# Patient Record
Sex: Female | Born: 1990 | Race: White | Hispanic: No | Marital: Single | Smoking: Current every day smoker
Health system: Southern US, Community
[De-identification: ages and names within clinical notes are randomized; demographics above are authoritative.]

## PROBLEM LIST (undated history)

## (undated) ENCOUNTER — Inpatient Hospital Stay (HOSPITAL_COMMUNITY): Payer: Self-pay

## (undated) DIAGNOSIS — K219 Gastro-esophageal reflux disease without esophagitis: Secondary | ICD-10-CM

## (undated) DIAGNOSIS — R52 Pain, unspecified: Secondary | ICD-10-CM

## (undated) DIAGNOSIS — N83209 Unspecified ovarian cyst, unspecified side: Secondary | ICD-10-CM

## (undated) DIAGNOSIS — F32A Depression, unspecified: Secondary | ICD-10-CM

## (undated) DIAGNOSIS — F329 Major depressive disorder, single episode, unspecified: Secondary | ICD-10-CM

## (undated) DIAGNOSIS — R51 Headache: Secondary | ICD-10-CM

## (undated) DIAGNOSIS — Z3490 Encounter for supervision of normal pregnancy, unspecified, unspecified trimester: Secondary | ICD-10-CM

## (undated) DIAGNOSIS — F419 Anxiety disorder, unspecified: Secondary | ICD-10-CM

## (undated) HISTORY — PX: ROOT CANAL: SHX2363

## (undated) HISTORY — DX: Pain, unspecified: R52

---
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
## 2012-01-12 ENCOUNTER — Emergency Department (HOSPITAL_BASED_OUTPATIENT_CLINIC_OR_DEPARTMENT_OTHER)
Admission: EM | Admit: 2012-01-12 | Discharge: 2012-01-12 | Disposition: A | Discharge status: Home / Self Care | Payer: Medicaid Other | Attending: Emergency Medicine | Admitting: Emergency Medicine

## 2012-01-12 ENCOUNTER — Encounter (HOSPITAL_BASED_OUTPATIENT_CLINIC_OR_DEPARTMENT_OTHER): Payer: Self-pay | Admitting: *Deleted

## 2012-01-12 DIAGNOSIS — K219 Gastro-esophageal reflux disease without esophagitis: Secondary | ICD-10-CM | POA: Insufficient documentation

## 2012-01-12 DIAGNOSIS — R197 Diarrhea, unspecified: Secondary | ICD-10-CM

## 2012-01-12 DIAGNOSIS — R109 Unspecified abdominal pain: Secondary | ICD-10-CM

## 2012-01-12 DIAGNOSIS — R112 Nausea with vomiting, unspecified: Secondary | ICD-10-CM

## 2012-01-12 LAB — URINALYSIS, ROUTINE W REFLEX MICROSCOPIC
Bilirubin Urine: NEGATIVE
Glucose, UA: NEGATIVE mg/dL
Hgb urine dipstick: NEGATIVE
Ketones, ur: NEGATIVE mg/dL
Leukocytes, UA: NEGATIVE
Nitrite: NEGATIVE
Protein, ur: NEGATIVE mg/dL
Specific Gravity, Urine: 1.039 — ABNORMAL HIGH (ref 1.005–1.030)
Urobilinogen, UA: 0.2 mg/dL (ref 0.0–1.0)
pH: 5.5 (ref 5.0–8.0)

## 2012-01-12 LAB — PREGNANCY, URINE: Preg Test, Ur: NEGATIVE

## 2012-01-12 MED ORDER — ONDANSETRON HCL 4 MG/2ML IJ SOLN
4.0000 mg | Freq: Once | INTRAMUSCULAR | Status: AC
  Administered 2012-01-12: 4 mg via INTRAVENOUS
  Filled 2012-01-12: qty 2

## 2012-01-12 MED ORDER — SODIUM CHLORIDE 0.9 % IV BOLUS (SEPSIS)
1000.0000 mL | Freq: Once | INTRAVENOUS | Status: AC
  Administered 2012-01-12: 1000 mL via INTRAVENOUS

## 2012-01-12 MED ORDER — LORAZEPAM 2 MG/ML IJ SOLN
1.0000 mg | Freq: Once | INTRAMUSCULAR | Status: AC
  Administered 2012-01-12: 1 mg via INTRAVENOUS
  Filled 2012-01-12: qty 1

## 2012-01-12 MED ORDER — HYDROMORPHONE HCL PF 1 MG/ML IJ SOLN
1.0000 mg | Freq: Once | INTRAMUSCULAR | Status: AC
  Administered 2012-01-12: 1 mg via INTRAVENOUS
  Filled 2012-01-12: qty 1

## 2012-01-12 MED ORDER — ONDANSETRON HCL 4 MG PO TABS: 4.0000 mg | ORAL_TABLET | Freq: Four times a day (QID) | ORAL | Status: AC

## 2012-01-12 NOTE — ED Provider Notes (Signed)
History    20yf with abdominal pain and n/v/d. Onset around 0300 today. Woke her up from sleep.  In usual state of health when went to bed. Pain is diffuse and crampy. Worse near umbilicus. Comes in waves but doesn't completely resolve. NBNB emesis. No blood in stool. No fever. No sick contacts. No urinary complaints. Doesn't think pregnant. No unusual vaginal bleeding or discharge. Denies hx of abdominal or pelvic surgery.  CSN: [id number]  Arrival date & time 01/12/12  0530   First MD Initiated Contact with Patient 01/12/12 0540      Chief Complaint  Patient presents with  . Emesis  . Diarrhea  . Abdominal Pain    (Consider location/radiation/quality/duration/timing/severity/associated sxs/prior treatment) HPI  Past Medical History  Diagnosis Date  . Anxiety   . Depression   . GERD (gastroesophageal reflux disease)     History reviewed. No pertinent past surgical history.  No family history on file.  History  Substance Use Topics  . Smoking status: Never Smoker   . Smokeless tobacco: Not on file  . Alcohol Use: No    OB History    Grav Para Term Preterm Abortions TAB SAB Ect Mult Living                  Review of Systems   Review of symptoms negative unless otherwise noted in HPI.   Allergies  Review of patient's allergies indicates no known allergies.  Home Medications   Current Outpatient Rx  Name Route Sig Dispense Refill  . ALBUTEROL 90 MCG/ACT IN AERS Inhalation Inhale 2 puffs into the lungs 2 (two) times daily as needed. For shortness of breath     . CLONAZEPAM 0.5 MG PO TABS Oral Take 0.25 mg by mouth 3 (three) times daily as needed. anxiety     . NORGESTIM-ETH ESTRAD TRIPHASIC 0.18/0.215/0.25 MG-35 MCG PO TABS Oral Take 1 tablet by mouth daily.      . ACETAMINOPHEN 500 MG PO TABS Oral Take 500 mg by mouth every 6 (six) hours as needed. pain     . ESCITALOPRAM OXALATE 10 MG PO TABS Oral Take 10 mg by mouth daily.      Marland Kitchen ESOMEPRAZOLE MAGNESIUM 40  MG PO CPDR Oral Take 1 capsule (40 mg total) by mouth daily. 30 capsule 0  . GUAIFENESIN ER 600 MG PO TB12 Oral Take 600 mg by mouth daily.      Marland Kitchen PROMETHAZINE HCL 25 MG PO TABS Oral Take 1 tablet (25 mg total) by mouth every 6 (six) hours as needed for nausea. 15 tablet 0  . PROMETHAZINE HCL 25 MG PO TABS Oral Take 25 mg by mouth 2 (two) times daily as needed. For nausea or vomiting       BP 124/90  Pulse 102  Temp 97.6 F (36.4 C) (Oral)  Resp 20  Ht 5' (1.524 m)  Wt 140 lb (63.504 kg)  BMI 27.34 kg/m2  SpO2 100%  LMP 12/21/2011  Physical Exam  Nursing note and vitals reviewed. Constitutional: She appears well-developed and well-nourished. No distress.  HENT:  Head: Normocephalic and atraumatic.  Eyes: Conjunctivae are normal. Right eye exhibits no discharge. Left eye exhibits no discharge.  Neck: Neck supple.  Cardiovascular: Normal rate, regular rhythm and normal heart sounds.  Exam reveals no gallop and no friction rub.   No murmur heard. Pulmonary/Chest: Effort normal and breath sounds normal. No respiratory distress.  Abdominal: Soft. She exhibits no distension and no mass. There is tenderness.  There is no rebound and no guarding.       Mild diffuse tenderness without rebound or guarding.  Genitourinary:       No cva tenderness  Musculoskeletal: She exhibits no edema and no tenderness.  Neurological: She is alert.  Skin: Skin is warm and dry.  Psychiatric: Her behavior is normal. Thought content normal.    ED Course  Procedures (including critical care time)  Labs Reviewed  URINALYSIS, ROUTINE W REFLEX MICROSCOPIC - Abnormal; Notable for the following:    Color, Urine AMBER (*)  BIOCHEMICALS MAY BE AFFECTED BY COLOR   APPearance CLOUDY (*)     Specific Gravity, Urine 1.039 (*)     All other components within normal limits  PREGNANCY, URINE   No results found.   1. Abdominal pain   2. Nausea and vomiting   3. Diarrhea       MDM  20yF with abdominal  pain and n/v/d. I suspect this is viral gastroenteritis. Given symptom duration of only a couple hours and reassuring exam, I do not feel that imaging or blood work indicated. I have a low suspicion for acute surgical process at this time. Plan IVF, meds and UA. Will continue to reasses.  7:23 AM Prior to DC pt reports feeling much better although still with some nausea. Repeat abdominal exam remains with mild diffuse tenderness. Does not localize. I remain with a low suspicion for acute abdomen but strict return precautions discussed. Imaging again consider but deferred. Outpt fu.        Raeford Razor, MD 01/12/12 [phone]

## 2012-03-06 ENCOUNTER — Emergency Department (HOSPITAL_BASED_OUTPATIENT_CLINIC_OR_DEPARTMENT_OTHER)
Admission: EM | Admit: 2012-03-06 | Discharge: 2012-03-06 | Disposition: A | Discharge status: Home / Self Care | Payer: Medicaid Other | Attending: Emergency Medicine | Admitting: Emergency Medicine

## 2012-03-06 ENCOUNTER — Emergency Department (HOSPITAL_BASED_OUTPATIENT_CLINIC_OR_DEPARTMENT_OTHER): Payer: Medicaid Other

## 2012-03-06 ENCOUNTER — Encounter (HOSPITAL_BASED_OUTPATIENT_CLINIC_OR_DEPARTMENT_OTHER): Payer: Self-pay

## 2012-03-06 ENCOUNTER — Inpatient Hospital Stay (HOSPITAL_COMMUNITY)
Admission: AD | Admit: 2012-03-06 | Discharge: 2012-03-06 | Disposition: A | Discharge status: Home / Self Care | Payer: Self-pay | Source: Ambulatory Visit | Attending: Obstetrics & Gynecology | Admitting: Obstetrics & Gynecology

## 2012-03-06 DIAGNOSIS — M545 Low back pain, unspecified: Secondary | ICD-10-CM | POA: Insufficient documentation

## 2012-03-06 DIAGNOSIS — Z331 Pregnant state, incidental: Secondary | ICD-10-CM | POA: Insufficient documentation

## 2012-03-06 DIAGNOSIS — R10819 Abdominal tenderness, unspecified site: Secondary | ICD-10-CM | POA: Insufficient documentation

## 2012-03-06 DIAGNOSIS — Z3490 Encounter for supervision of normal pregnancy, unspecified, unspecified trimester: Secondary | ICD-10-CM

## 2012-03-06 DIAGNOSIS — R109 Unspecified abdominal pain: Secondary | ICD-10-CM | POA: Insufficient documentation

## 2012-03-06 HISTORY — DX: Unspecified ovarian cyst, unspecified side: N83.209

## 2012-03-06 LAB — BASIC METABOLIC PANEL
CO2: 22 mEq/L (ref 19–32)
Chloride: 99 mEq/L (ref 96–112)
Creatinine, Ser: 0.5 mg/dL (ref 0.50–1.10)
GFR calc Af Amer: >90 mL/min (ref >90)
Potassium: 4.1 mEq/L (ref 3.5–5.1)

## 2012-03-06 LAB — CBC WITH DIFFERENTIAL/PLATELET
Basophils Absolute: 0 10*3/uL (ref 0.0–0.1)
Basophils Relative: 0 % (ref 0–1)
HCT: 38.9 % (ref 36.0–46.0)
Hemoglobin: 13.1 g/dL (ref 12.0–15.0)
Lymphocytes Relative: 18 % (ref 12–46)
Monocytes Absolute: 1.2 10*3/uL — ABNORMAL HIGH (ref 0.1–1.0)
Neutro Abs: 9.8 10*3/uL — ABNORMAL HIGH (ref 1.7–7.7)
Neutrophils Relative %: 72 % (ref 43–77)
RDW: 13.6 % (ref 11.5–15.5)
WBC: 13.6 10*3/uL — ABNORMAL HIGH (ref 4.0–10.5)

## 2012-03-06 LAB — ABO/RH: ABO/RH(D): A POS

## 2012-03-06 LAB — URINALYSIS, ROUTINE W REFLEX MICROSCOPIC
Glucose, UA: NEGATIVE mg/dL
Hgb urine dipstick: NEGATIVE
Leukocytes, UA: NEGATIVE
Specific Gravity, Urine: 1.026 (ref 1.005–1.030)

## 2012-03-06 LAB — WET PREP, GENITAL
Trich, Wet Prep: NONE SEEN
Yeast Wet Prep HPF POC: NONE SEEN

## 2012-03-06 LAB — PREGNANCY, URINE: Preg Test, Ur: POSITIVE — AB

## 2012-03-06 LAB — HCG, QUANTITATIVE, PREGNANCY: hCG, Beta Chain, Quant, S: 79369 m[IU]/mL — ABNORMAL HIGH (ref <5)

## 2012-03-06 MED ORDER — ONDANSETRON HCL 4 MG/2ML IJ SOLN
4.0000 mg | Freq: Once | INTRAMUSCULAR | Status: AC
  Administered 2012-03-06: 4 mg via INTRAVENOUS
  Filled 2012-03-06: qty 2

## 2012-03-06 MED ORDER — ONDANSETRON HCL 4 MG PO TABS: 4.0000 mg | ORAL_TABLET | Freq: Three times a day (TID) | ORAL | Status: AC | PRN

## 2012-03-06 MED ORDER — SODIUM CHLORIDE 0.9 % IV BOLUS (SEPSIS)
1000.0000 mL | Freq: Once | INTRAVENOUS | Status: AC
  Administered 2012-03-06: 1000 mL via INTRAVENOUS

## 2012-03-06 NOTE — ED Notes (Signed)
C/o abd pain x 4 days-2 positive preg test-LMP 01/30/12

## 2012-03-06 NOTE — ED Provider Notes (Signed)
History     CSN: [id number]  Arrival date & time 03/06/12  1355   First MD Initiated Contact with Patient 03/06/12 1454      Chief Complaint  Patient presents with  . Abdominal Pain    (Consider location/radiation/quality/duration/timing/severity/associated sxs/prior treatment) HPI Pt reports several days of moderate aching lower abdominal pain, associated with nausea and occasional vomiting. She has also had some aching in R flank, no diarrhea or constipation. No dysuria or hematuria, but some increased urinary frequency. Denies any vaginal bleeding or discharge but has had a positive pregnancy test at home. No particular provoking or relieving factors.   Past Medical History  Diagnosis Date  . Anxiety   . Depression   . GERD (gastroesophageal reflux disease)   . Ovarian cyst     History reviewed. No pertinent past surgical history.  No family history on file.  History  Substance Use Topics  . Smoking status: Current Some Day Smoker  . Smokeless tobacco: Not on file  . Alcohol Use: No    OB History    Grav Para Term Preterm Abortions TAB SAB Ect Mult Living                  Review of Systems All other systems reviewed and are negative except as noted in HPI.   Allergies  Review of patient's allergies indicates no known allergies.  Home Medications   Current Outpatient Rx  Name Route Sig Dispense Refill  . ACETAMINOPHEN 500 MG PO TABS Oral Take 500 mg by mouth every 6 (six) hours as needed. pain     . ALBUTEROL 90 MCG/ACT IN AERS Inhalation Inhale 2 puffs into the lungs 2 (two) times daily as needed. For shortness of breath     . CLONAZEPAM 0.5 MG PO TABS Oral Take 0.25 mg by mouth 3 (three) times daily as needed. anxiety     . ESCITALOPRAM OXALATE 10 MG PO TABS Oral Take 10 mg by mouth daily.      Marland Kitchen ESOMEPRAZOLE MAGNESIUM 40 MG PO CPDR Oral Take 1 capsule (40 mg total) by mouth daily. 30 capsule 0  . GUAIFENESIN ER 600 MG PO TB12 Oral Take 600 mg by mouth  daily.      Marland Kitchen NORGESTIM-ETH ESTRAD TRIPHASIC 0.18/0.215/0.25 MG-35 MCG PO TABS Oral Take 1 tablet by mouth daily.      Marland Kitchen PROMETHAZINE HCL 25 MG PO TABS Oral Take 1 tablet (25 mg total) by mouth every 6 (six) hours as needed for nausea. 15 tablet 0  . PROMETHAZINE HCL 25 MG PO TABS Oral Take 25 mg by mouth 2 (two) times daily as needed. For nausea or vomiting       BP 138/81  Pulse 88  Temp 98 F (36.7 C) (Oral)  Resp 20  Ht 5\' 3"  (1.6 m)  Wt 135 lb (61.236 kg)  BMI 23.91 kg/m2  SpO2 100%  LMP 01/30/2012  Physical Exam  Nursing note and vitals reviewed. Constitutional: She is oriented to person, place, and time. She appears well-developed and well-nourished.  HENT:  Head: Normocephalic and atraumatic.  Eyes: EOM are normal. Pupils are equal, round, and reactive to light.  Neck: Normal range of motion. Neck supple.  Cardiovascular: Normal rate, normal heart sounds and intact distal pulses.   Pulmonary/Chest: Effort normal and breath sounds normal.  Abdominal: Bowel sounds are normal. She exhibits no distension. There is tenderness (suprapubic tenderness is mild). There is no rebound and no guarding.  Genitourinary: Uterus  normal. Uterus is not tender. Cervix exhibits no motion tenderness, no discharge and no friability. Right adnexum displays tenderness. Right adnexum displays no mass. Left adnexum displays no mass and no tenderness.  Musculoskeletal: Normal range of motion. She exhibits no edema and no tenderness.  Neurological: She is alert and oriented to person, place, and time. She has normal strength. No cranial nerve deficit or sensory deficit.  Skin: Skin is warm and dry. No rash noted.  Psychiatric: She has a normal mood and affect.    ED Course  Procedures (including critical care time)  Labs Reviewed  URINALYSIS, ROUTINE W REFLEX MICROSCOPIC - Abnormal; Notable for the following:    APPearance HAZY (*)     All other components within normal limits  PREGNANCY, URINE -  Abnormal; Notable for the following:    Preg Test, Ur POSITIVE (*)     All other components within normal limits  CBC WITH DIFFERENTIAL - Abnormal; Notable for the following:    WBC 13.6 (*)     Neutro Abs 9.8 (*)     Monocytes Absolute 1.2 (*)     All other components within normal limits  BASIC METABOLIC PANEL - Abnormal; Notable for the following:    Sodium 134 (*)     All other components within normal limits  HCG, QUANTITATIVE, PREGNANCY - Abnormal; Notable for the following:    hCG, Beta Chain, Quant, [place] [address] (*)     All other components within normal limits  WET PREP, GENITAL - Abnormal; Notable for the following:    Clue Cells Wet Prep HPF POC FEW (*)     WBC, Wet Prep HPF POC FEW (*)     All other components within normal limits  GC/CHLAMYDIA PROBE AMP, GENITAL  ABO/RH   US Ob Comp Less 14 Wks  03/06/2012  *RADIOLOGY REPORT*  Clinical Data: Patient pregnant complaining of pelvic pain and low back pain.  History of ovarian cysts.  OBSTETRIC <14 WK [place] AND TRANSVAGINAL OB US  Technique:  Both transabdominal and transvaginal ultrasound examinations were performed for complete evaluation of the gestation as well as the maternal uterus, adnexal regions, and pelvic cul-de-sac.  Transvaginal technique was performed to assess early pregnancy.  Comparison:  None.  Intrauterine gestational sac:  Visualized/normal in shape. Yolk sac: No sac visualized, normal in configuration Embryo: There is a fetal pole with fetal heart activity documented Cardiac Activity: The heart rate documented at 160 beats per minute Heart Rate: 160 beats per minute bpm  MSD: Not measured but, according to in size with the embryo CRL: 12.6  mm  seven w  3 d         [place] EDC: 10/20/2012  Maternal uterus/adnexae: The uterus is unremarkable.  Left ovary demonstrates a 1.7 cm hypoechoic lesion most likely a corpus luteum.  Ovaries are otherwise unremarkable.  No adnexal masses.  No free fluid.  IMPRESSION: Single live  intrauterine pregnancy with a measured gestational age of [redacted] weeks and 3 days.  No emerging complication.  1.7 cm hypoechoic left ovarian lesion, likely a corpus luteum.   Original Report Authenticated By: Domenic Moras, M.D.    US Ob Transvaginal  03/06/2012  *RADIOLOGY REPORT*  Clinical Data: Patient pregnant complaining of pelvic pain and low back pain.  History of ovarian cysts.  OBSTETRIC <14 WK [place] AND TRANSVAGINAL OB US  Technique:  Both transabdominal and transvaginal ultrasound examinations were performed for complete evaluation of the gestation as well as the maternal  uterus, adnexal regions, and pelvic cul-de-sac.  Transvaginal technique was performed to assess early pregnancy.  Comparison:  None.  Intrauterine gestational sac:  Visualized/normal in shape. Yolk sac: No sac visualized, normal in configuration Embryo: There is a fetal pole with fetal heart activity documented Cardiac Activity: The heart rate documented at 160 beats per minute Heart Rate: 160 beats per minute bpm  MSD: Not measured but, according to in size with the embryo CRL: 12.6  mm  seven w  3 d         [place] EDC: 10/20/2012  Maternal uterus/adnexae: The uterus is unremarkable.  Left ovary demonstrates a 1.7 cm hypoechoic lesion most likely a corpus luteum.  Ovaries are otherwise unremarkable.  No adnexal masses.  No free fluid.  IMPRESSION: Single live intrauterine pregnancy with a measured gestational age of [redacted] weeks and 3 days.  No emerging complication.  1.7 cm hypoechoic left ovarian lesion, likely a corpus luteum.   Original Report Authenticated By: Domenic Moras, M.D.      No diagnosis found.    MDM  Labs and [place] as above consistent with normal IUP. Rh is not resulted yet but with no bleeding, unlikely to give Rhogam at this visit regardless. Pt advised to stop medications harmful to fetus, begin multivitamin, no EtOH, smoking or drug use. Zofran for nausea. Establish with Ob.        Johnathyn Viscomi B. Bernette Mayers,  MD 03/06/12 1610

## 2012-03-17 ENCOUNTER — Emergency Department (HOSPITAL_BASED_OUTPATIENT_CLINIC_OR_DEPARTMENT_OTHER)
Admission: EM | Admit: 2012-03-17 | Discharge: 2012-03-17 | Disposition: A | Discharge status: Home / Self Care | Payer: Medicaid Other | Attending: Emergency Medicine | Admitting: Emergency Medicine

## 2012-03-17 ENCOUNTER — Encounter (HOSPITAL_BASED_OUTPATIENT_CLINIC_OR_DEPARTMENT_OTHER): Payer: Self-pay | Admitting: *Deleted

## 2012-03-17 DIAGNOSIS — O211 Hyperemesis gravidarum with metabolic disturbance: Secondary | ICD-10-CM | POA: Insufficient documentation

## 2012-03-17 DIAGNOSIS — O210 Mild hyperemesis gravidarum: Secondary | ICD-10-CM

## 2012-03-17 DIAGNOSIS — E860 Dehydration: Secondary | ICD-10-CM | POA: Insufficient documentation

## 2012-03-17 DIAGNOSIS — R42 Dizziness and giddiness: Secondary | ICD-10-CM

## 2012-03-17 HISTORY — DX: Encounter for supervision of normal pregnancy, unspecified, unspecified trimester: Z34.90

## 2012-03-17 LAB — COMPREHENSIVE METABOLIC PANEL
ALT: 9 U/L (ref 0–35)
AST: 13 U/L (ref 0–37)
Albumin: 3.5 g/dL (ref 3.5–5.2)
Alkaline Phosphatase: 66 U/L (ref 39–117)
Chloride: 102 mEq/L (ref 96–112)
Potassium: 4 mEq/L (ref 3.5–5.1)
Sodium: 136 mEq/L (ref 135–145)
Total Protein: 6.6 g/dL (ref 6.0–8.3)

## 2012-03-17 LAB — URINALYSIS, ROUTINE W REFLEX MICROSCOPIC
Bilirubin Urine: NEGATIVE
Glucose, UA: NEGATIVE mg/dL
Hgb urine dipstick: NEGATIVE
Nitrite: NEGATIVE
Specific Gravity, Urine: 1.034 — ABNORMAL HIGH (ref 1.005–1.030)
pH: 7 (ref 5.0–8.0)

## 2012-03-17 LAB — CBC WITH DIFFERENTIAL/PLATELET
Basophils Absolute: 0 10*3/uL (ref 0.0–0.1)
Basophils Relative: 0 % (ref 0–1)
Eosinophils Absolute: 0.2 10*3/uL (ref 0.0–0.7)
MCH: 29 pg (ref 26.0–34.0)
MCHC: 33.9 g/dL (ref 30.0–36.0)
Neutro Abs: 8.3 10*3/uL — ABNORMAL HIGH (ref 1.7–7.7)
Neutrophils Relative %: 68 % (ref 43–77)
Platelets: 263 10*3/uL (ref 150–400)

## 2012-03-17 LAB — HCG, QUANTITATIVE, PREGNANCY: hCG, Beta Chain, Quant, S: 85641 m[IU]/mL — ABNORMAL HIGH (ref <5)

## 2012-03-17 LAB — URINE MICROSCOPIC-ADD ON

## 2012-03-17 MED ORDER — NITROFURANTOIN MONOHYD MACRO 100 MG PO CAPS
100.0000 mg | ORAL_CAPSULE | Freq: Once | ORAL | Status: AC
  Administered 2012-03-17: 100 mg via ORAL
  Filled 2012-03-17: qty 1

## 2012-03-17 MED ORDER — NITROFURANTOIN MONOHYD MACRO 100 MG PO CAPS: 100.0000 mg | ORAL_CAPSULE | Freq: Two times a day (BID) | ORAL | Status: DC

## 2012-03-17 MED ORDER — SODIUM CHLORIDE 0.9 % IV BOLUS (SEPSIS)
1000.0000 mL | Freq: Once | INTRAVENOUS | Status: AC
  Administered 2012-03-17: 1000 mL via INTRAVENOUS

## 2012-03-17 MED ORDER — METOCLOPRAMIDE HCL 5 MG/ML IJ SOLN
10.0000 mg | Freq: Once | INTRAMUSCULAR | Status: AC
  Administered 2012-03-17: 10 mg via INTRAVENOUS
  Filled 2012-03-17: qty 2

## 2012-03-17 MED ORDER — METOCLOPRAMIDE HCL 10 MG PO TABS: 10.0000 mg | ORAL_TABLET | Freq: Four times a day (QID) | ORAL | Status: DC

## 2012-03-17 NOTE — ED Provider Notes (Signed)
History     CSN: [id number]  Arrival date & time 03/17/12  4098   First MD Initiated Contact with Patient 03/17/12 1944      Chief Complaint  Patient presents with  . Dizziness    (Consider location/radiation/quality/duration/timing/severity/associated sxs/prior treatment) The history is provided by the patient.  Ashley Walls is a 21 y.o. female hx of depression, anxiety here with dizziness, dehydration. Recently diagnosed with intrauterine pregnancy (confirmed on [place] 2 weeks ago), now [redacted] weeks pregnant here with dizziness, vomiting. Intermittent vomiting for several weeks, but able to tolerate PO. Today, while at work, she felt dizzy and lightheaded. Some vertigo as well. No syncope or chest pain or SOB. No vaginal bleeding, just occasional cramps.    Past Medical History  Diagnosis Date  . Anxiety   . Depression   . GERD (gastroesophageal reflux disease)   . Ovarian cyst   . Pregnant     History reviewed. No pertinent past surgical history.  No family history on file.  History  Substance Use Topics  . Smoking status: Former Smoker    Quit date: 02/19/2012  . Smokeless tobacco: Never Used  . Alcohol Use: No    OB History    Grav Para Term Preterm Abortions TAB SAB Ect Mult Living                  Review of Systems  Gastrointestinal: Positive for vomiting.  Neurological: Positive for dizziness.  All other systems reviewed and are negative.    Allergies  Review of patient's allergies indicates no known allergies.  Home Medications   Current Outpatient Rx  Name Route Sig Dispense Refill  . PROMETHAZINE HCL 25 MG PO TABS Oral Take 25 mg by mouth every 6 (six) hours as needed.    . ACETAMINOPHEN 500 MG PO TABS Oral Take 500 mg by mouth every 6 (six) hours as needed. pain     . ALBUTEROL 90 MCG/ACT IN AERS Inhalation Inhale 2 puffs into the lungs 2 (two) times daily as needed. For shortness of breath     . ESOMEPRAZOLE MAGNESIUM 40 MG PO CPDR Oral Take 1 capsule  (40 mg total) by mouth daily. 30 capsule 0    BP 117/75  Pulse 93  Temp 98.3 F (36.8 C) (Oral)  Resp 18  Ht 5' (1.524 m)  Wt 134 lb (60.782 kg)  BMI 26.17 kg/m2  SpO2 99%  LMP 01/30/2012  Physical Exam  Nursing note and vitals reviewed. Constitutional: She is oriented to person, place, and time. She appears well-developed and well-nourished.       anxious  HENT:  Head: Normocephalic.       OP slightly dry  Eyes: Conjunctivae normal are normal. Pupils are equal, round, and reactive to light.       No nystagmus.   Neck: Normal range of motion. Neck supple.  Cardiovascular: Normal rate, regular rhythm and normal heart sounds.   Pulmonary/Chest: Effort normal and breath sounds normal. No respiratory distress. She has no wheezes.  Abdominal: Soft. Bowel sounds are normal. She exhibits no distension. There is no tenderness.  Musculoskeletal: Normal range of motion.  Neurological: She is alert and oriented to person, place, and time. No cranial nerve deficit.       Nl strength and sensation   Skin: Skin is warm and dry.  Psychiatric: She has a normal mood and affect. Her behavior is normal. Judgment and thought content normal.    ED Course  Procedures (including  critical care time)  Labs Reviewed  CBC WITH DIFFERENTIAL - Abnormal; Notable for the following:    WBC 12.3 (*)     Neutro Abs 8.3 (*)     Monocytes Absolute 1.1 (*)     All other components within normal limits  COMPREHENSIVE METABOLIC PANEL - Abnormal; Notable for the following:    Total Bilirubin 0.1 (*)     All other components within normal limits  HCG, QUANTITATIVE, PREGNANCY - Abnormal; Notable for the following:    hCG, Beta Chain, Quant, [place] [address] (*)     All other components within normal limits  URINALYSIS, ROUTINE W REFLEX MICROSCOPIC - Abnormal; Notable for the following:    Specific Gravity, Urine 1.034 (*)     Protein, ur 30 (*)     All other components within normal limits  URINE MICROSCOPIC-ADD  ON - Abnormal; Notable for the following:    Squamous Epithelial / LPF FEW (*)     Bacteria, UA MANY (*)     All other components within normal limits   No results found.   1. Hyperemesis gravidarum   2. Dizziness   3. Dehydration       MDM  Ashley Walls is a 21 y.o. female [redacted] weeks pregnant here with hyperemesis and dizziness likely from dehydration. Will check labs, UA, given IVF and reglan and reassess.   9:36 PM Patient felt better after reglan and fluids. Labs showed Wbc 12, UA + bacteria. She was given macrobid. No vaginal discharge so no ultrasound performed. She is d/c home on regland and macrobid.         Richardean Canal, MD 03/17/12 2137

## 2012-03-17 NOTE — ED Notes (Signed)
Pt reports dizziness and nausea while working yesterday, still dizzy today- pt states she is [redacted] weeks pregnant- c/o intermittent sharp pelvic pain - some vaginal d/c- denies cramping or bleeding

## 2012-03-17 NOTE — ED Notes (Signed)
MD at bedside. 

## 2012-04-24 ENCOUNTER — Other Ambulatory Visit: Payer: Self-pay | Admitting: Obstetrics and Gynecology

## 2012-04-24 LAB — OB RESULTS CONSOLE RPR: RPR: NONREACTIVE

## 2012-04-24 LAB — OB RESULTS CONSOLE HEPATITIS B SURFACE ANTIGEN: Hepatitis B Surface Ag: NEGATIVE

## 2012-04-24 LAB — OB RESULTS CONSOLE GC/CHLAMYDIA: Chlamydia: NEGATIVE

## 2012-04-24 LAB — OB RESULTS CONSOLE ANTIBODY SCREEN: Antibody Screen: NEGATIVE

## 2012-04-27 ENCOUNTER — Encounter (HOSPITAL_COMMUNITY): Payer: Self-pay | Admitting: *Deleted

## 2012-04-27 ENCOUNTER — Inpatient Hospital Stay (HOSPITAL_COMMUNITY)
Admission: AD | Admit: 2012-04-27 | Discharge: 2012-04-27 | Disposition: A | Discharge status: Home / Self Care | Payer: Medicaid Other | Source: Ambulatory Visit | Attending: Obstetrics and Gynecology | Admitting: Obstetrics and Gynecology

## 2012-04-27 DIAGNOSIS — O9989 Other specified diseases and conditions complicating pregnancy, childbirth and the puerperium: Secondary | ICD-10-CM

## 2012-04-27 DIAGNOSIS — R109 Unspecified abdominal pain: Secondary | ICD-10-CM | POA: Insufficient documentation

## 2012-04-27 DIAGNOSIS — N949 Unspecified condition associated with female genital organs and menstrual cycle: Secondary | ICD-10-CM

## 2012-04-27 DIAGNOSIS — O26899 Other specified pregnancy related conditions, unspecified trimester: Secondary | ICD-10-CM

## 2012-04-27 DIAGNOSIS — O99891 Other specified diseases and conditions complicating pregnancy: Secondary | ICD-10-CM | POA: Insufficient documentation

## 2012-04-27 LAB — URINALYSIS, ROUTINE W REFLEX MICROSCOPIC
Glucose, UA: NEGATIVE mg/dL
Hgb urine dipstick: NEGATIVE
Ketones, ur: NEGATIVE mg/dL
Leukocytes, UA: NEGATIVE
Protein, ur: NEGATIVE mg/dL

## 2012-04-27 NOTE — MAU Provider Note (Signed)
  History     CSN: [id number]  Arrival date and time: 04/27/12 2030   First Provider Initiated Contact with Patient 04/27/12 2251      Chief Complaint  Patient presents with  . Abdominal Pain   HPI Ms Bansal is a 21yo G1P0 at approx 14.6wks who presents for eval of intermittent low abd sharp pains x 2 days. Denies vag bldg or leak. No N/V/D, fever or dysuria. Took Ibuprofen earlier this evening which gave some relief of the pain but she still desired eval.   Past Medical History  Diagnosis Date  . Anxiety   . Depression   . GERD (gastroesophageal reflux disease)   . Ovarian cyst   . Pregnant     No past surgical history on file.  Family History  Problem Relation Age of Onset  . COPD Mother   . Asthma Mother   . Depression Mother     History  Substance Use Topics  . Smoking status: Former Smoker    Quit date: 02/19/2012  . Smokeless tobacco: Never Used  . Alcohol Use: No    Allergies: No Known Allergies  Prescriptions prior to admission  Medication Sig Dispense Refill  . ibuprofen (ADVIL,MOTRIN) 200 MG tablet Take 800 mg by mouth every 6 (six) hours as needed. pain      . ondansetron (ZOFRAN) 4 MG tablet Take 4 mg by mouth every 8 (eight) hours as needed. nausea      . Prenatal Vit-Fe Fumarate-FA (PRENATAL MULTIVITAMIN) TABS Take 1 tablet by mouth daily.      Marland Kitchen albuterol (PROVENTIL,VENTOLIN) 90 MCG/ACT inhaler Inhale 2 puffs into the lungs 2 (two) times daily as needed. For shortness of breath         ROS  Physical Exam   Blood pressure 115/67, pulse 86, temperature 97.7 F (36.5 C), temperature source Oral, resp. rate 20, height 5' (1.524 m), weight 64.32 kg (141 lb 12.8 oz), last menstrual period 01/30/2012.  Physical Exam  Constitutional: She is oriented to person, place, and time. She appears well-developed.  HENT:  Head: Normocephalic.  Cardiovascular: Normal rate.   Respiratory: Effort normal.  GI: Soft.       Fundal height c/w 15 wks, approx 3FBs  below umbilicus; FHTs dopplered 150; exam unremarkable- unable to elicit pain  Genitourinary: Vagina normal.       Cx closed/thick/post  Musculoskeletal: Normal range of motion.  Neurological: She is alert and oriented to person, place, and time.  Skin: Skin is warm and dry.  Psychiatric: She has a normal mood and affect. Her behavior is normal. Thought content normal.   Urinalysis    Component Value Date/Time   COLORURINE YELLOW 04/27/2012 2100   APPEARANCEUR CLEAR 04/27/2012 2100   LABSPEC 1.025 04/27/2012 2100   PHURINE 6.0 04/27/2012 2100   GLUCOSEU NEGATIVE 04/27/2012 2100   HGBUR NEGATIVE 04/27/2012 2100   BILIRUBINUR NEGATIVE 04/27/2012 2100   KETONESUR NEGATIVE 04/27/2012 2100   PROTEINUR NEGATIVE 04/27/2012 2100   UROBILINOGEN 0.2 04/27/2012 2100   NITRITE NEGATIVE 04/27/2012 2100   LEUKOCYTESUR NEGATIVE 04/27/2012 2100     MAU Course  Procedures    Assessment and Plan  IUP at 14.6wks Round lig pain  D/C home with comfort tips and reassurance given May take Ibuprofen 600mg  prn until approx 28wks for similar symptoms F/U on Nov 13th with OB as sched or sooner prn  Cam Hai 04/27/2012, 10:57 PM

## 2012-04-27 NOTE — MAU Note (Signed)
Yesterday at work started having lower back pains shooting down legs. Today has turned into pain in lower abd that comes and goes. Feels like when I had cysts. Was at work tonight and couldn't stand so I left. Splitting migraine. Took Ibuprofen 800mg  at 1300 and helped lower abd pain for about and then they were right back like before.

## 2012-06-01 ENCOUNTER — Encounter (HOSPITAL_COMMUNITY): Payer: Self-pay | Admitting: *Deleted

## 2012-06-01 ENCOUNTER — Inpatient Hospital Stay (HOSPITAL_COMMUNITY): Payer: Medicaid Other

## 2012-06-01 ENCOUNTER — Observation Stay (HOSPITAL_COMMUNITY)
Admission: AD | Admit: 2012-06-01 | Discharge: 2012-06-01 | Disposition: A | Discharge status: Home / Self Care | Payer: Medicaid Other | Source: Ambulatory Visit | Attending: Obstetrics and Gynecology | Admitting: Obstetrics and Gynecology

## 2012-06-01 DIAGNOSIS — O4700 False labor before 37 completed weeks of gestation, unspecified trimester: Principal | ICD-10-CM | POA: Insufficient documentation

## 2012-06-01 DIAGNOSIS — K5900 Constipation, unspecified: Secondary | ICD-10-CM

## 2012-06-01 DIAGNOSIS — O99891 Other specified diseases and conditions complicating pregnancy: Secondary | ICD-10-CM | POA: Insufficient documentation

## 2012-06-01 DIAGNOSIS — K219 Gastro-esophageal reflux disease without esophagitis: Secondary | ICD-10-CM | POA: Insufficient documentation

## 2012-06-01 DIAGNOSIS — O321XX0 Maternal care for breech presentation, not applicable or unspecified: Secondary | ICD-10-CM

## 2012-06-01 DIAGNOSIS — O471 False labor at or after 37 completed weeks of gestation: Secondary | ICD-10-CM

## 2012-06-01 DIAGNOSIS — R109 Unspecified abdominal pain: Secondary | ICD-10-CM | POA: Insufficient documentation

## 2012-06-01 LAB — CBC
MCV: 87.8 fL (ref 78.0–100.0)
Platelets: 218 10*3/uL (ref 150–400)
RBC: 4.17 MIL/uL (ref 3.87–5.11)
WBC: 15.3 10*3/uL — ABNORMAL HIGH (ref 4.0–10.5)

## 2012-06-01 LAB — URINALYSIS, ROUTINE W REFLEX MICROSCOPIC
Bilirubin Urine: NEGATIVE
Glucose, UA: NEGATIVE mg/dL
Ketones, ur: NEGATIVE mg/dL
pH: 5.5 (ref 5.0–8.0)

## 2012-06-01 MED ORDER — INDOMETHACIN 50 MG RE SUPP
50.0000 mg | Freq: Once | RECTAL | Status: AC
  Administered 2012-06-01: 50 mg via RECTAL
  Filled 2012-06-01: qty 1

## 2012-06-01 MED ORDER — DOCUSATE SODIUM 100 MG PO CAPS: 100.0000 mg | ORAL_CAPSULE | Freq: Every day | ORAL | Status: DC

## 2012-06-01 MED ORDER — INDOMETHACIN 25 MG PO CAPS: 25.0000 mg | ORAL_CAPSULE | Freq: Four times a day (QID) | ORAL | Status: DC

## 2012-06-01 MED ORDER — DOCUSATE SODIUM 100 MG PO CAPS: 100.0000 mg | ORAL_CAPSULE | Freq: Two times a day (BID) | ORAL | Status: DC

## 2012-06-01 MED ORDER — CALCIUM CARBONATE ANTACID 500 MG PO CHEW: 2.0000 | CHEWABLE_TABLET | ORAL | Status: DC | PRN

## 2012-06-01 MED ORDER — OMEPRAZOLE MAGNESIUM 20 MG PO TBEC: 40.0000 mg | DELAYED_RELEASE_TABLET | Freq: Every day | ORAL | Status: DC

## 2012-06-01 MED ORDER — BUTORPHANOL TARTRATE 1 MG/ML IJ SOLN: 2.0000 mg | INTRAMUSCULAR | Status: DC | PRN

## 2012-06-01 MED ORDER — ACETAMINOPHEN 325 MG PO TABS: 650.0000 mg | ORAL_TABLET | ORAL | Status: DC | PRN

## 2012-06-01 MED ORDER — BUTORPHANOL TARTRATE 1 MG/ML IJ SOLN
1.0000 mg | INTRAMUSCULAR | Status: DC | PRN
  Administered 2012-06-01: 1 mg via INTRAVENOUS
  Filled 2012-06-01: qty 1

## 2012-06-01 MED ORDER — PRENATAL MULTIVITAMIN CH: 1.0000 | ORAL_TABLET | Freq: Every day | ORAL | Status: DC

## 2012-06-01 MED ORDER — ZOLPIDEM TARTRATE 5 MG PO TABS: 5.0000 mg | ORAL_TABLET | Freq: Every evening | ORAL | Status: DC | PRN

## 2012-06-01 MED ORDER — POLYETHYLENE GLYCOL 3350 17 G PO PACK: 17.0000 g | PACK | Freq: Every day | ORAL | Status: DC

## 2012-06-01 MED ORDER — LACTATED RINGERS IV SOLN
INTRAVENOUS | Status: DC
  Administered 2012-06-01: 07:00:00 via INTRAVENOUS

## 2012-06-01 NOTE — H&P (Signed)
21 y.o. yo complains of cramping and feeling like she has to push.  Pt was checked in MAU and called 7 cm by NP.  However, when I checked the patient I could feel a bulging lower uterine segment but also the external cervical os which was closed.  She denies lof and bleeding.  Fetal movement and FHTs were present.  Past Medical History  Diagnosis Date  . Anxiety   . Depression   . GERD (gastroesophageal reflux disease)   . Ovarian cyst   . Pregnant   History reviewed. No pertinent past surgical history.  History   Social History  . Marital Status: Single    Spouse Name: N/A    Number of Children: N/A  . Years of Education: N/A   Occupational History  . Not on file.   Social History Main Topics  . Smoking status: Former Smoker    Quit date: 02/19/2012  . Smokeless tobacco: Never Used  . Alcohol Use: No  . Drug Use: No  . Sexually Active: Yes    Birth Control/ Protection: None   Other Topics Concern  . Not on file   Social History Narrative  . No narrative on file    No current facility-administered medications on file prior to encounter.   Current Outpatient Prescriptions on File Prior to Encounter  Medication Sig Dispense Refill  . ondansetron (ZOFRAN) 4 MG tablet Take 4 mg by mouth every 8 (eight) hours as needed. nausea      . Prenatal Vit-Fe Fumarate-FA (PRENATAL MULTIVITAMIN) TABS Take 1 tablet by mouth daily.      Marland Kitchen albuterol (PROVENTIL,VENTOLIN) 90 MCG/ACT inhaler Inhale 2 puffs into the lungs 2 (two) times daily as needed. For shortness of breath       . ibuprofen (ADVIL,MOTRIN) 200 MG tablet Take 800 mg by mouth every 6 (six) hours as needed. pain      . [DISCONTINUED] clonazePAM (KLONOPIN) 0.5 MG tablet Take 0.25 mg by mouth 3 (three) times daily as needed. anxiety       . [DISCONTINUED] escitalopram (LEXAPRO) 10 MG tablet Take 10 mg by mouth daily.        . [DISCONTINUED] Norgestimate-Ethinyl Estradiol Triphasic (TRI-SPRINTEC) 0.18/0.215/0.25 MG-35 MCG tablet  Take 1 tablet by mouth daily.        No Known Allergies  Filed Vitals:   06/01/12 0728  BP: 131/83  Pulse: 90  Temp: 98 F (36.7 C)  Resp: 20      Abdomen:  soft, nontender, nondistended. Ex:  no cords, erythema Pelvic:  FT external os, 1-1.5 cm long and bulging lower uterine segment.   SSE Cervix appears closed and at least 1 cm long.  A:  21 y.o. G1P0 [redacted]w[redacted]d with cramping and bulging lower uterine segment but no dilation apparent at this point.   P:  Bedrest.  Indocin for stopping contractions.  U/S for cervical length, anatomy and placenta.  Pt may be candidate for cerclage if ctxes stop.  Ashley Walls A

## 2012-06-01 NOTE — MAU Note (Signed)
Pt reports "contractions and the urge to push" this am, pt is 21.[redacted] weeks pregnant

## 2012-06-01 NOTE — MAU Provider Note (Signed)
Chief Complaint:  Abdominal Pain   First Provider Initiated Contact with Patient 06/01/12 0701      HPI: Ashley Walls is a 21 y.o. G1P0 at [redacted]w[redacted]d who presents to maternity admissions via EMS reporting waking this morning with painful UCs q 3-4 min. Denies leaking, bleeding. Denies pregnancy complications to date.  Pregnancy Course: Uncomplicated to date per pt. (PN record not available). MAU visit 1 month ago for RLP  Past Medical History: Past Medical History  Diagnosis Date  . Anxiety   . Depression   . GERD (gastroesophageal reflux disease)   . Ovarian cyst   . Pregnant     Past obstetric history: OB History    Grav Para Term Preterm Abortions TAB SAB Ect Mult Living   1              # Outc Date GA Lbr Len/2nd Wgt Sex Del Anes PTL Lv   1 CUR               Past Surgical History: No past surgical history on file.  Family History: Family History  Problem Relation Age of Onset  . COPD Mother   . Asthma Mother   . Depression Mother     Social History: History  Substance Use Topics  . Smoking status: Former Smoker    Quit date: 02/19/2012  . Smokeless tobacco: Never Used  . Alcohol Use: No    Allergies: No Known Allergies  Meds:  Prescriptions prior to admission  Medication Sig Dispense Refill  . ondansetron (ZOFRAN) 4 MG tablet Take 4 mg by mouth every 8 (eight) hours as needed. nausea      . Prenatal Vit-Fe Fumarate-FA (PRENATAL MULTIVITAMIN) TABS Take 1 tablet by mouth daily.      Marland Kitchen albuterol (PROVENTIL,VENTOLIN) 90 MCG/ACT inhaler Inhale 2 puffs into the lungs 2 (two) times daily as needed. For shortness of breath       . ibuprofen (ADVIL,MOTRIN) 200 MG tablet Take 800 mg by mouth every 6 (six) hours as needed. pain        ROS: Pertinent findings in history of present illness.  Physical Exam  Blood pressure 133/74, pulse 73, temperature 97.3 F (36.3 C), temperature source Oral, resp. rate 20, last menstrual period 01/30/2012. GENERAL: Well-developed,  well-nourished female in distress breathing through contractions, anxious  HEENT: normocephalic HEART: normal rate RESP: normal effort ABDOMEN: Soft, non-tender, gravid appropriate for gestational age EXTREMITIES: Nontender, no edema NEURO: alert and oriented SPECULUM EXAM: NEFG, physiologic discharge, no blood, cervix clean Dilation: 7 Presentation: Homero Fellers Breech Cx 7-8/ breech protruding through cx +2, no blood or fluid noted FHT:  148 Contractions: q 3 mins   Labs: No results found for this or any previous visit (from the past 24 hour(s)).  Imaging:  No results found. MAU Course: IV LR 1000 bolus in progress Stadol ordered  Assessment: 1. Preterm labor   2. Breech presentation   Advanced labor  Plan: C/W Dr. Henderson Cloud: Admit to 3rd floor for expectant management   Danae Orleans, CNM 06/01/2012 7:15 AM

## 2012-06-01 NOTE — Progress Notes (Signed)
Dr. Tenny Craw informed of the preliminary verbal results of the cervical length.  Per MD d/c the OB complete u/s.  It is not needed.

## 2012-06-01 NOTE — Discharge Summary (Signed)
Obstetric Discharge Summary Reason for Admission: Preterm contractions, suspected cervical dilation Prenatal Procedures: ultrasound Intrapartum Procedures: Ultrasound, indocin, IVF hydration Postpartum Procedures: NA Complications-Operative and Postpartum: NA Hemoglobin  Date Value Range Status  06/01/2012 12.1  12.0 - 15.0 g/dL Final     HCT  Date Value Range Status  06/01/2012 36.6  36.0 - 46.0 % Final    Physical Exam:  General: alert, cooperative and appears stated age Uterine: Soft, NT, ND Cvx: 3cm long, closed, thick, no funnel  Discharge Diagnoses: False labor, constipation. Pt admitted from MAU for PTL due to being "7cm" dilated.  Pt evaluated by Dr. Henderson Cloud.  Speculum exam showed visually closed.  Cervix palpably closed.  TAUS showed cervical length 3cm long, no funnel.  Pt was started on indocin.  Will continue for 48 hrs.  Pt has had significant constipation.  Will start daily stool softeners.  Discharge Information: Date: 06/01/2012 Activity: pelvic rest Diet: routine Medications: Colace and Miralax, Indocin Condition: improved Instructions: refer to practice specific booklet and See instruction sheet Discharge to: home Follow-up Information    Follow up with Almon Hercules., MD. In 1 week. (For continued prenatal care)    Contact information:   [address] [place] [address] [phone]         Lashawnda Hancox H. 06/01/2012, 11:12 AM

## 2012-06-01 NOTE — Progress Notes (Signed)
Patient ID: Ashley Walls, female   DOB: 07/31/1990, 21 y.o.   MRN: [id number]   S: Pt comfortable now after stadol and indocin.  Has been having problems with constipation.  Occassional nausea, vomiting, and heartburn. OCeasar Mons Vitals:   06/01/12 0652 06/01/12 0728 06/01/12 0916  BP: 133/74 131/83 116/65  Pulse: 73 90 92  Temp: 97.3 F (36.3 C) 98 F (36.7 C)   TempSrc: Oral Oral   Resp: 20 20 18    AOx3 abd soft NT/ND Cvx TAUS 3cm long, closed, no funnel  A/P 1) TVUS shows closed cervix with long cervical length.  No evidence of PTL or cervical incompetence. Pt started on indocin.  Will continue indocin 25mg  Q6 x 48 hrs.  2) Suspect abdominal pain/crampin/sensation for need to push due to constipation.  Start colace or miralax daily. Titrate to 1 soft BM daily or every other day.  3) GERD: Prilosec daily.  In short term may also use maalox 4) D/C home, F/U office next week

## 2012-09-14 ENCOUNTER — Inpatient Hospital Stay (HOSPITAL_COMMUNITY)
Admission: AD | Admit: 2012-09-14 | Discharge: 2012-09-15 | Disposition: A | Discharge status: Home / Self Care | Payer: Medicaid Other | Source: Ambulatory Visit | Attending: Obstetrics and Gynecology | Admitting: Obstetrics and Gynecology

## 2012-09-14 ENCOUNTER — Encounter (HOSPITAL_COMMUNITY): Payer: Self-pay

## 2012-09-14 DIAGNOSIS — O4703 False labor before 37 completed weeks of gestation, third trimester: Secondary | ICD-10-CM

## 2012-09-14 DIAGNOSIS — O479 False labor, unspecified: Secondary | ICD-10-CM

## 2012-09-14 DIAGNOSIS — O99891 Other specified diseases and conditions complicating pregnancy: Secondary | ICD-10-CM | POA: Insufficient documentation

## 2012-09-14 LAB — POCT FERN TEST: POCT Fern Test: NEGATIVE

## 2012-09-14 NOTE — MAU Provider Note (Signed)
History     CSN: [id number]  Arrival date and time: 09/14/12 2211   First Provider Initiated Contact with Patient 09/14/12 2300      Chief Complaint  Patient presents with  . Rupture of Membranes   HPI Ashley Walls 22 y.o. [redacted]w[redacted]d  Comes to MAU reporting leaking of fluid while at work tonight.  Works at [employer] and is on her feet a lot.  No leaking seen on pad on arrival to MAU.  OB History   Grav Para Term Preterm Abortions TAB SAB Ect Mult Living   1 0 0 0 0 0 0 0 0 0       Past Medical History  Diagnosis Date  . Anxiety   . Depression   . GERD (gastroesophageal reflux disease)   . Ovarian cyst   . Pregnant     Past Surgical History  Procedure Laterality Date  . Root canal      Family History  Problem Relation Age of Onset  . COPD Mother   . Asthma Mother   . Depression Mother     History  Substance Use Topics  . Smoking status: Former Smoker    Quit date: 02/19/2012  . Smokeless tobacco: Never Used  . Alcohol Use: No    Allergies: No Known Allergies  Prescriptions prior to admission  Medication Sig Dispense Refill  . acetaminophen (TYLENOL) 325 MG tablet Take 650 mg by mouth daily as needed. For pain      . omeprazole (PRILOSEC OTC) 20 MG tablet Take 2 tablets (40 mg total) by mouth daily.  30 tablet  1  . ondansetron (ZOFRAN) 4 MG tablet Take 4 mg by mouth every 8 (eight) hours as needed. nausea      . Prenatal Vit-Fe Fumarate-FA (PRENATAL MULTIVITAMIN) TABS Take 1 tablet by mouth daily.      Marland Kitchen albuterol (PROVENTIL,VENTOLIN) 90 MCG/ACT inhaler Inhale 2 puffs into the lungs 2 (two) times daily as needed. For shortness of breath       . docusate sodium (COLACE) 100 MG capsule Take 1 capsule (100 mg total) by mouth 2 (two) times daily.  60 capsule  0  . indomethacin (INDOCIN) 25 MG capsule Take 1 capsule (25 mg total) by mouth 4 (four) times daily.  8 capsule  0  . polyethylene glycol (MIRALAX / GLYCOLAX) packet Take 17 g by mouth daily.  30 each  0     Review of Systems  Constitutional: Negative for fever.  Gastrointestinal: Negative for nausea, vomiting, abdominal pain, diarrhea and constipation.  Genitourinary: Negative for dysuria.       Leaking No vaginal discharge. No vaginal bleeding.   Physical Exam   Blood pressure 140/79, pulse 101, temperature 97.8 F (36.6 C), resp. rate 20, height 5' (1.524 m), weight 181 lb 3.2 oz (82.192 kg), last menstrual period 01/30/2012, SpO2 99.00%.  Physical Exam  Nursing note and vitals reviewed. Constitutional: She is oriented to person, place, and time. She appears well-developed and well-nourished. No distress.  HENT:  Head: Normocephalic.  Eyes: EOM are normal.  Neck: Neck supple.  GI: Soft. There is no tenderness. There is no rebound and no guarding.  Irregular contractions noted on strip. FHT baseline 135, moderate variability and reactive.  Genitourinary:  No leaking seen on peripad. Speculum exam.  No pooling in vagina.  Minimal white discharge.  No leaking with valsalva.  Fern slide made and read by RN - negative.  No clinical evidence of ROM. Cervix closed and  thick.  Musculoskeletal: Normal range of motion.  Neurological: She is alert and oriented to person, place, and time.  Skin: Skin is warm and dry.  Psychiatric: She has a normal mood and affect.    MAU Course  Procedures  MDM 2320  Consult with Dr. Henderson Cloud and reviewed exam and plan of care.   Assessment and Plan  [redacted]w[redacted]d  Pregnancy - no ROM  Plan Discharge home Keep appointments in the office. Return if has regular contractions and additional leaking, but today water has not broken. Ashley Walls 09/14/2012, 11:16 PM

## 2012-09-14 NOTE — MAU Note (Signed)
Started leaking fld at work about ago. 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 Continue to leak fld. Was having contractions at work but now just a lot of pelvic pressure

## 2012-09-14 NOTE — MAU Note (Addendum)
Lilyan Punt NP performed speculum exam to check for pooling of amniotic fluid and rule out ROM. No pooling or leaking noted.

## 2012-10-07 ENCOUNTER — Encounter (HOSPITAL_COMMUNITY): Payer: Self-pay | Admitting: *Deleted

## 2012-10-07 ENCOUNTER — Inpatient Hospital Stay (HOSPITAL_COMMUNITY)
Admission: AD | Admit: 2012-10-07 | Discharge: 2012-10-07 | Disposition: A | Discharge status: Home / Self Care | Payer: Medicaid Other | Source: Ambulatory Visit | Attending: Obstetrics and Gynecology | Admitting: Obstetrics and Gynecology

## 2012-10-07 DIAGNOSIS — R109 Unspecified abdominal pain: Secondary | ICD-10-CM | POA: Insufficient documentation

## 2012-10-07 DIAGNOSIS — O99891 Other specified diseases and conditions complicating pregnancy: Secondary | ICD-10-CM | POA: Insufficient documentation

## 2012-10-07 DIAGNOSIS — Y92009 Unspecified place in unspecified non-institutional (private) residence as the place of occurrence of the external cause: Secondary | ICD-10-CM | POA: Insufficient documentation

## 2012-10-07 DIAGNOSIS — O479 False labor, unspecified: Secondary | ICD-10-CM | POA: Insufficient documentation

## 2012-10-07 DIAGNOSIS — IMO0002 Reserved for concepts with insufficient information to code with codable children: Secondary | ICD-10-CM | POA: Insufficient documentation

## 2012-10-07 DIAGNOSIS — S3991XA Unspecified injury of abdomen, initial encounter: Secondary | ICD-10-CM

## 2012-10-07 MED ORDER — ACETAMINOPHEN 500 MG PO TABS
1000.0000 mg | ORAL_TABLET | Freq: Four times a day (QID) | ORAL | Status: DC | PRN
  Administered 2012-10-07: 1000 mg via ORAL
  Filled 2012-10-07: qty 2

## 2012-10-07 NOTE — MAU Provider Note (Signed)
History     CSN: [id number]  Arrival date and time: 10/07/12 1213   None     Chief Complaint  Patient presents with  . Abdominal Pain    had dog jump on stomach   HPI 22 y.o. G2P0010 at [redacted]w[redacted]d with abdominal pani for 1 hour after dog jumped on her. Scratched her stomach. 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 Jumped up on R side. Was having some ctx - 9 in 20 min this morning but resolved. Now having ctx again, occasionally having to breathe through them, feeling them every few minutes. No bleeding. Pain is all over abdomen now. Baby moving normally this morning. Right after dog jumped, didn't feel baby moving well but now feeling once in a while.   Saw OB Friday, was 1 cm. No complications with this pregnancy.   OB History   Grav Para Term Preterm Abortions TAB SAB Ect Mult Living   2 0 0 0 1 0 1 0 0 0       Past Medical History  Diagnosis Date  . Anxiety   . Depression   . GERD (gastroesophageal reflux disease)   . Ovarian cyst   . Pregnant     Past Surgical History  Procedure Laterality Date  . Root canal      Family History  Problem Relation Age of Onset  . COPD Mother   . Asthma Mother   . Depression Mother     History  Substance Use Topics  . Smoking status: Former Smoker    Quit date: 02/19/2012  . Smokeless tobacco: Never Used  . Alcohol Use: No    Allergies: No Known Allergies  Prescriptions prior to admission  Medication Sig Dispense Refill  . acetaminophen (TYLENOL) 325 MG tablet Take 650 mg by mouth daily as needed. For pain      . albuterol (PROVENTIL,VENTOLIN) 90 MCG/ACT inhaler Inhale 2 puffs into the lungs 2 (two) times daily as needed. For shortness of breath       . docusate sodium (COLACE) 100 MG capsule Take 1 capsule (100 mg total) by mouth 2 (two) times daily.  60 capsule  0  . indomethacin (INDOCIN) 25 MG capsule Take 1 capsule (25 mg total) by mouth 4 (four) times daily.  8 capsule  0  . omeprazole (PRILOSEC OTC) 20 MG tablet Take 2 tablets  (40 mg total) by mouth daily.  30 tablet  1  . ondansetron (ZOFRAN) 4 MG tablet Take 4 mg by mouth every 8 (eight) hours as needed. nausea      . polyethylene glycol (MIRALAX / GLYCOLAX) packet Take 17 g by mouth daily.  30 each  0  . Prenatal Vit-Fe Fumarate-FA (PRENATAL MULTIVITAMIN) TABS Take 1 tablet by mouth daily.        ROS Negative except as stated in HPI  Physical Exam   Blood pressure 138/78, pulse 87, temperature 98.2 F (36.8 C), temperature source Oral, resp. rate 18, height 5' (1.524 m), weight 82.555 kg (182 lb), last menstrual period 01/30/2012.  Physical Exam  Constitutional: She is oriented to person, place, and time. She appears well-developed and well-nourished. She appears distressed (anxious).  HENT:  Head: Normocephalic and atraumatic.  Eyes: Conjunctivae and EOM are normal.  Neck: Normal range of motion. Neck supple.  Cardiovascular: Normal rate, regular rhythm and normal heart sounds.   Respiratory: Effort normal and breath sounds normal. No respiratory distress.  GI: Soft. Bowel sounds are normal. There is tenderness (diffuse, but more so in RLQ).  There is no rebound and no guarding.  Mild abrasion on R side of abdomen from dog scratch - no bleeding or bruising  Genitourinary:  Normal external genitalia and vagina. No CMT. Cervix 1/40/-3, posterior. No bleeding.  Musculoskeletal: Normal range of motion. She exhibits no edema and no tenderness.  Neurological: She is alert and oriented to person, place, and time.  Skin: Skin is warm and dry.  Psychiatric: She has a normal mood and affect.    FHTs:  120, moderate variability, accels present, no decels (occasional short variable) TOCO:  Irritability - no ctx  MAU Course  Procedures  Pt reports feeling better after food, fluids and tylenol. Baby moving more actively after one hour observation.   Assessment and Plan  22 y.o. G2P0010 at [redacted]w[redacted]d with abdominal pain - Low impact trauma, does have mild  contractions but cervix unchanged, no bleeding - FHTs reassuring, fetal movement back to normal - Reassured patient, precautions regarding bleeding, pain, fetal movement as well as regular labor precautions. - Blood type A+  Discussed with Dr. Claiborne Billings - okay to discharge home.  Napoleon Form 10/07/2012, 12:50 PM

## 2012-10-07 NOTE — MAU Note (Signed)
Has been having cramping for about 3 days.  Dog jumped on her having strong lower left abdominal  Pain.  "it feels like a contraction that will not go away.  No bleeding, ROM

## 2012-10-10 ENCOUNTER — Telehealth (HOSPITAL_COMMUNITY): Payer: Self-pay | Admitting: *Deleted

## 2012-10-10 ENCOUNTER — Encounter (HOSPITAL_COMMUNITY): Payer: Self-pay | Admitting: *Deleted

## 2012-10-10 NOTE — Telephone Encounter (Signed)
Preadmission screen  

## 2012-10-15 ENCOUNTER — Observation Stay (HOSPITAL_COMMUNITY)
Admission: RE | Admit: 2012-10-15 | Discharge: 2012-10-15 | Disposition: A | Discharge status: Home / Self Care | Payer: Medicaid Other | Source: Ambulatory Visit | Attending: Obstetrics and Gynecology | Admitting: Obstetrics and Gynecology

## 2012-10-15 ENCOUNTER — Encounter (HOSPITAL_COMMUNITY): Payer: Self-pay

## 2012-10-15 DIAGNOSIS — O321XX0 Maternal care for breech presentation, not applicable or unspecified: Principal | ICD-10-CM | POA: Insufficient documentation

## 2012-10-15 MED ORDER — BUTORPHANOL TARTRATE 1 MG/ML IJ SOLN
1.0000 mg | Freq: Once | INTRAMUSCULAR | Status: AC
  Administered 2012-10-15: 1 mg via INTRAVENOUS

## 2012-10-15 MED ORDER — TERBUTALINE SULFATE 1 MG/ML IJ SOLN
INTRAMUSCULAR | Status: AC
  Filled 2012-10-15: qty 1

## 2012-10-15 MED ORDER — BUTORPHANOL TARTRATE 1 MG/ML IJ SOLN
INTRAMUSCULAR | Status: AC
  Filled 2012-10-15: qty 1

## 2012-10-15 MED ORDER — LACTATED RINGERS IV SOLN: INTRAVENOUS | Status: DC

## 2012-10-15 MED ORDER — TERBUTALINE SULFATE 1 MG/ML IJ SOLN
0.2500 mg | Freq: Once | INTRAMUSCULAR | Status: AC
  Administered 2012-10-15: 0.25 mg via SUBCUTANEOUS

## 2012-10-15 NOTE — ED Provider Notes (Signed)
Patient presents as an outpatient for attempted version on [employer].  Terbutalene and Stadol preop.  Nst reactive.  Failed version attempt x 2.  Patient declined further attempts due to pain.  Will keep appointment in office tomorrow and will schedule cesarean for breech delivery.

## 2012-10-15 NOTE — H&P (Signed)
22 y.o. G1P0  Estimated Date of Delivery: 10/20/12 admitted at 39/[redacted] weeks gestation for cesarean.  Patient with Breech presentation.  Failed version attempt on 4/21.  Prenatal Transfer Tool  Maternal Diabetes: No Genetic Screening: Normal Maternal Ultrasounds/Referrals: Normal Fetal Ultrasounds or other Referrals:  None Maternal Substance Abuse:  No Significant Maternal Medications:  None Significant Maternal Lab Results: None Other Significant Pregnancy Complications:  Breech presentation at term  Afebrile, VSS Heart and Lungs: No active disease Abdomen: soft, gravid, EFW AGA. Cervical exam:  Deferred.  Impression: Breech presentation  Plan:  Primary cesarean delivery.

## 2012-10-16 ENCOUNTER — Other Ambulatory Visit: Payer: Self-pay | Admitting: Obstetrics & Gynecology

## 2012-10-16 ENCOUNTER — Encounter (HOSPITAL_COMMUNITY): Payer: Self-pay

## 2012-10-16 ENCOUNTER — Encounter (HOSPITAL_COMMUNITY)
Admission: RE | Admit: 2012-10-16 | Discharge: 2012-10-16 | Disposition: A | Discharge status: Home / Self Care | Payer: Medicaid Other | Source: Ambulatory Visit | Attending: Obstetrics & Gynecology | Admitting: Obstetrics & Gynecology

## 2012-10-16 HISTORY — DX: Headache: R51

## 2012-10-16 LAB — RPR: RPR Ser Ql: NONREACTIVE

## 2012-10-16 LAB — CBC
MCH: 28.6 pg (ref 26.0–34.0)
MCV: 87.4 fL (ref 78.0–100.0)
Platelets: 178 10*3/uL (ref 150–400)
RBC: 4.54 MIL/uL (ref 3.87–5.11)
RDW: 15.1 % (ref 11.5–15.5)

## 2012-10-16 LAB — TYPE AND SCREEN

## 2012-10-16 IMAGING — US US ABDOMEN COMPLETE
1 series · 14 of 25 positions shown · non-contrast
Comparison: Ultrasound 06/11/2010

CLINICAL DATA: Abdominal pain

COMPLETE ABDOMINAL ULTRASOUND

[Series 1: us abdomen complete · 0.32mm/px · 14 of 62 slices shown]
[im 1/62]
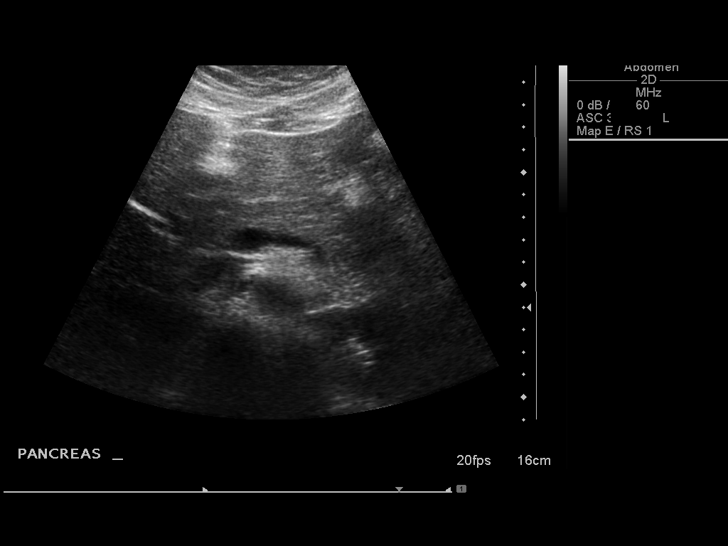
[im 6/62]
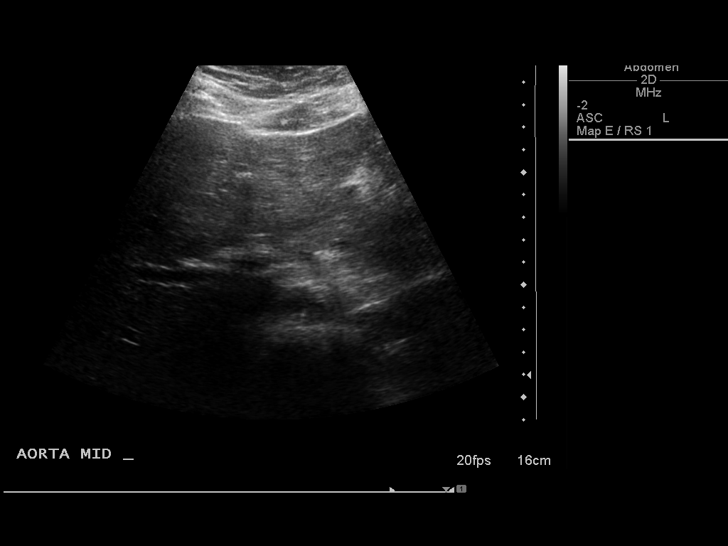
[im 11/62]
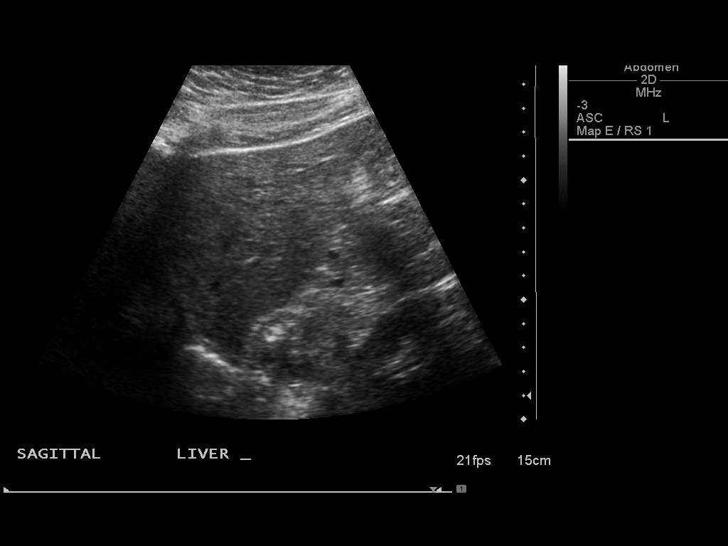
[im 16/62]
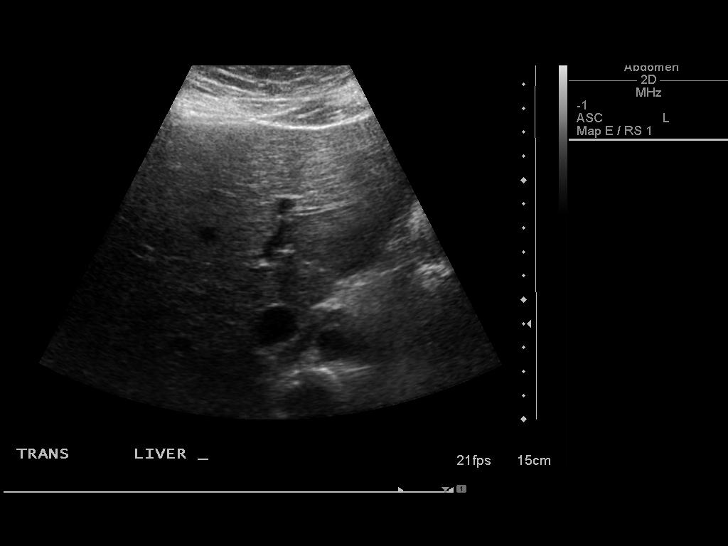
[im 21/62]
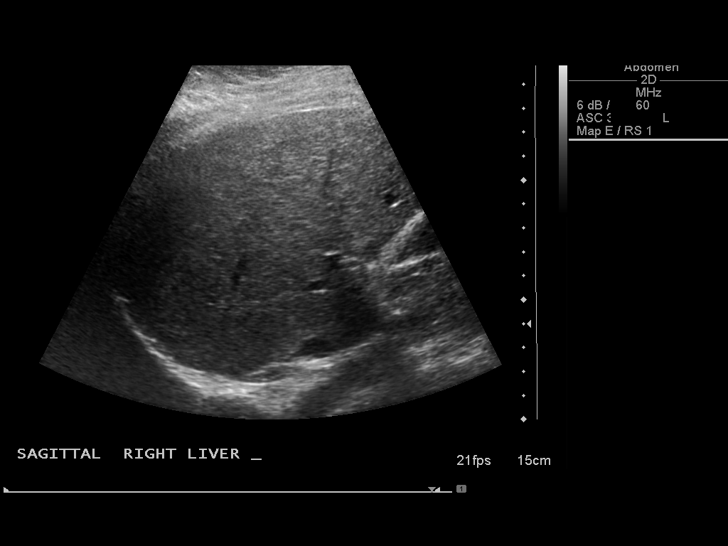
[im 23/62]
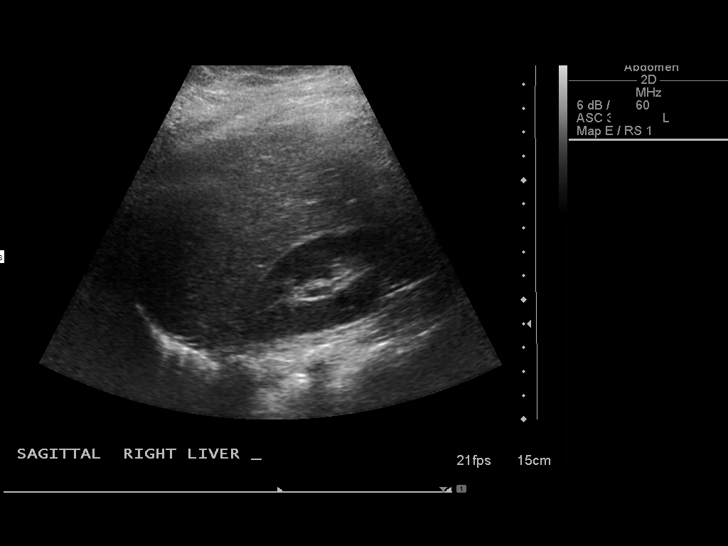
[im 28/62]
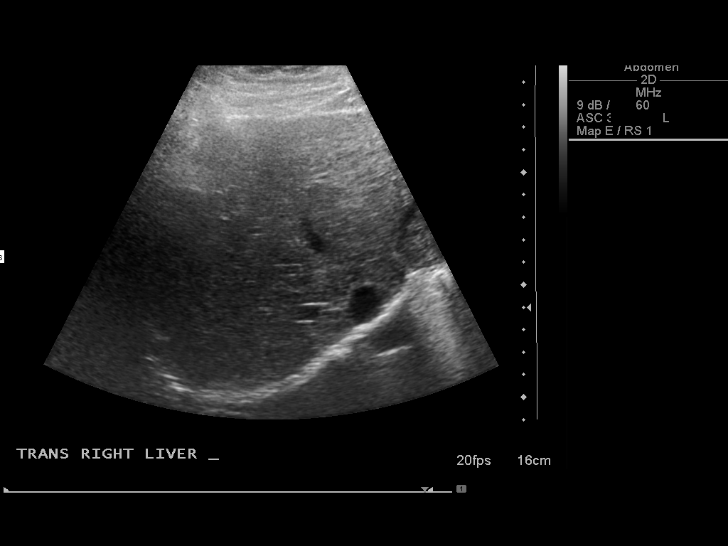
[im 34/62]
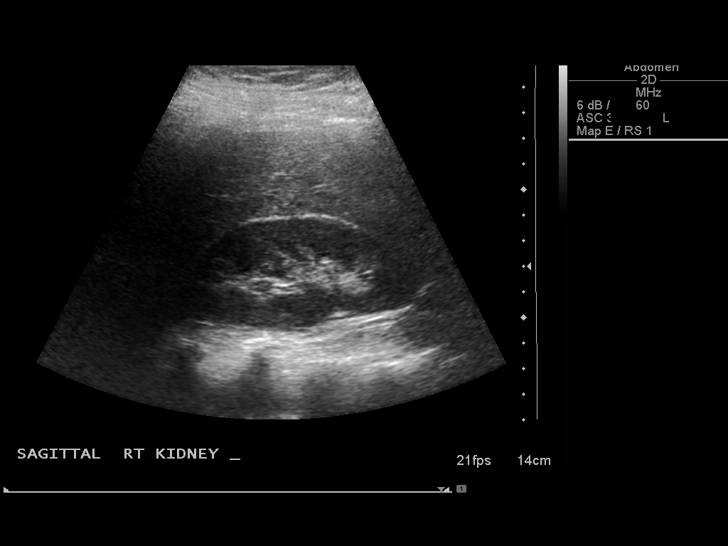
[im 39/62]
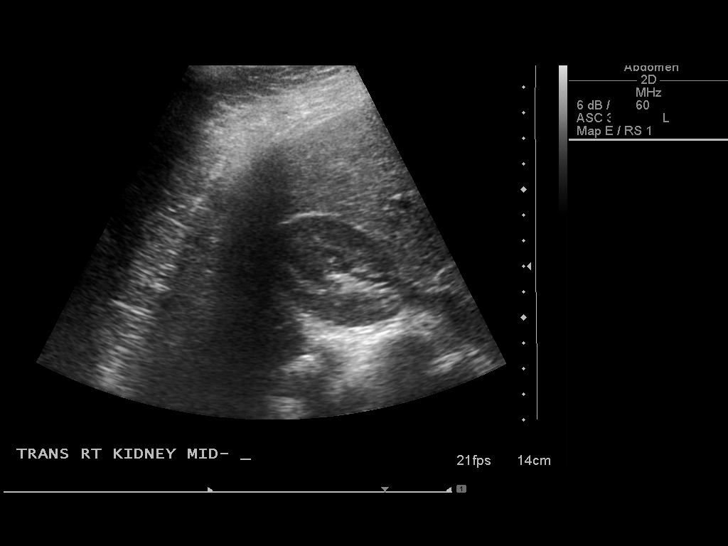
[im 41/62]
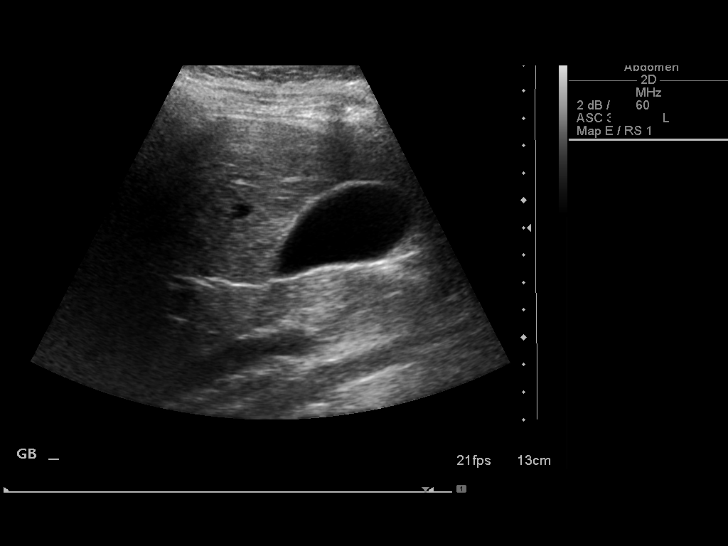
[im 46/62]
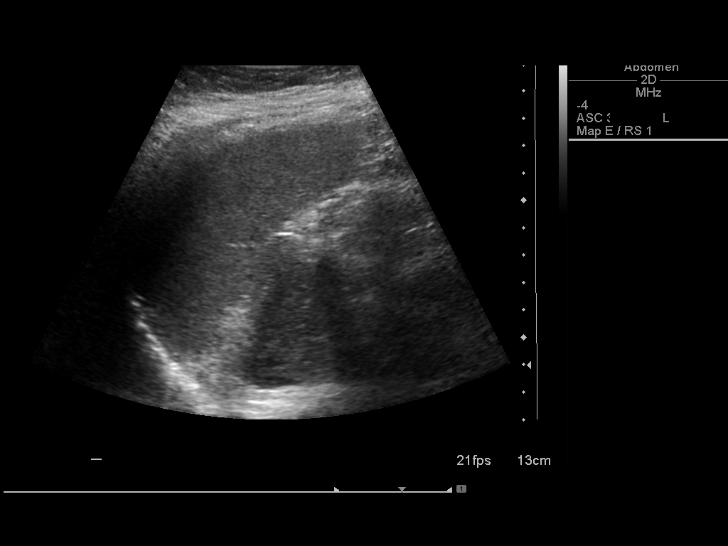
[im 51/62]
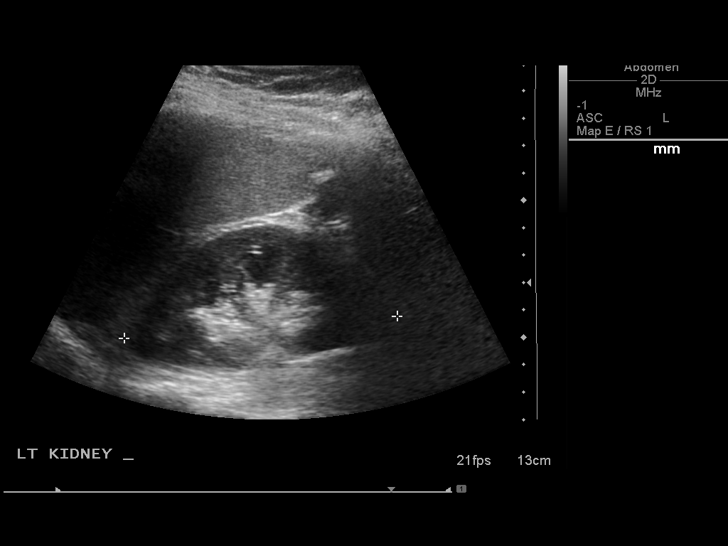
[im 56/62]
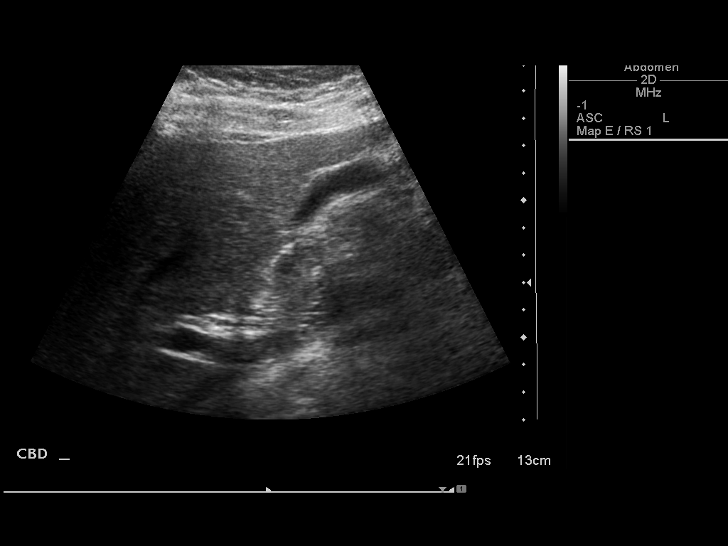
[im 62/62]
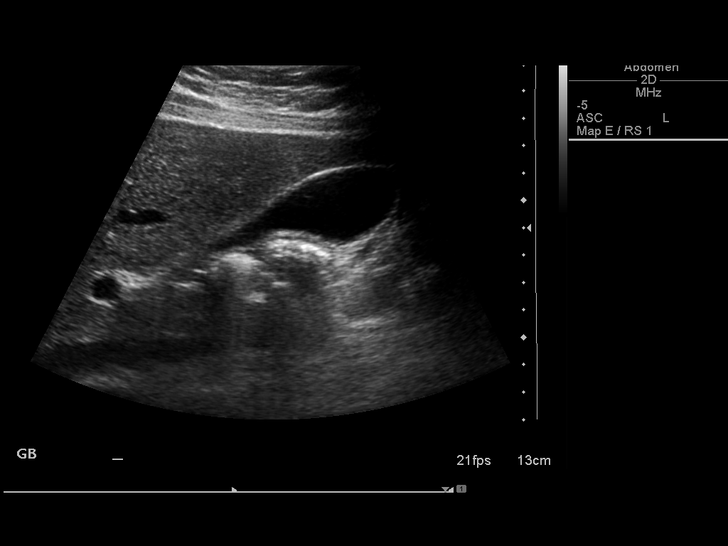

[14 of 25 positions shown; findings below may reference images not displayed]

FINDINGS: Gallbladder:  No gallstones, gallbladder wall thickening, or
pericholecystic fluid.

Common bile duct:  3.4 mm

Liver:  Coarse echotexture without focal lesion

IVC:  Appears normal.

Pancreas:  No focal abnormality seen.

Spleen:  6.2 cm

Right Kidney:  10.1 cm without obstruction.

Left Kidney:  10.0 cm without obstruction.

Abdominal aorta:  1.8 cm in diameter
IMPRESSION: Negative for gallstones.  No biliary ductal dilatation.

## 2012-10-16 IMAGING — US US PELVIS COMPLETE
1 series · 14 of 25 positions shown · non-contrast
Comparison: None.

CLINICAL DATA: Pelvic pain



[Series 1: us pelvis complete · 0.24mm/px · 14 of 38 slices shown]
[im 1/38]
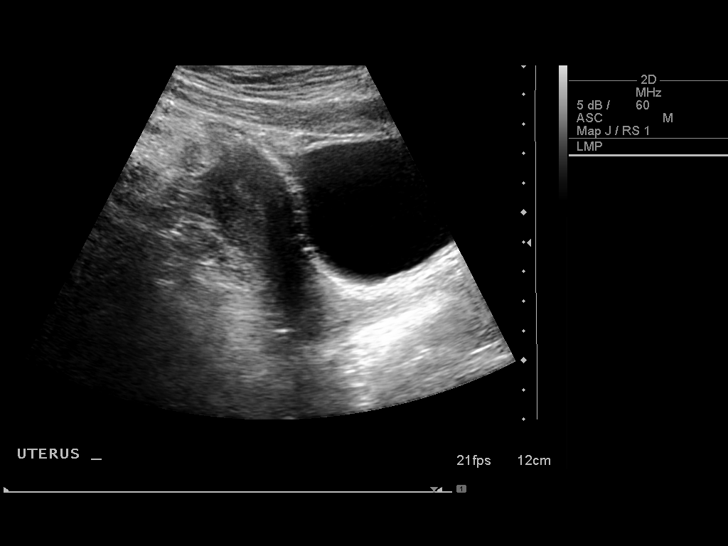
[im 4/38]
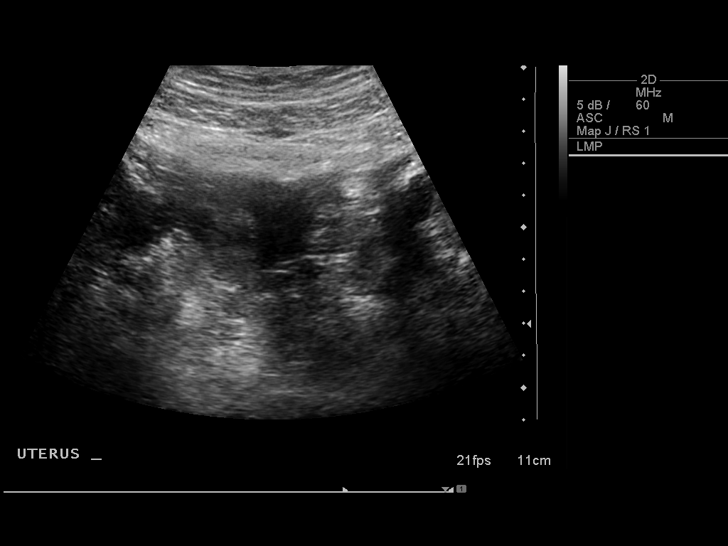
[im 7/38]
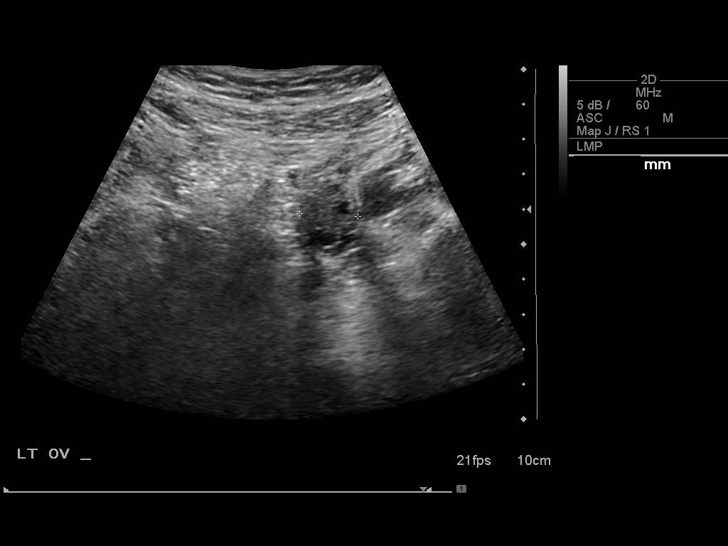
[im 10/38]
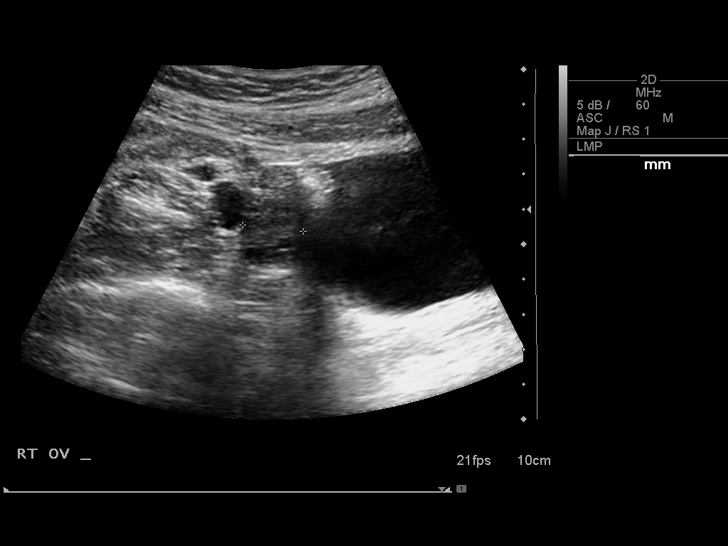
[im 13/38]
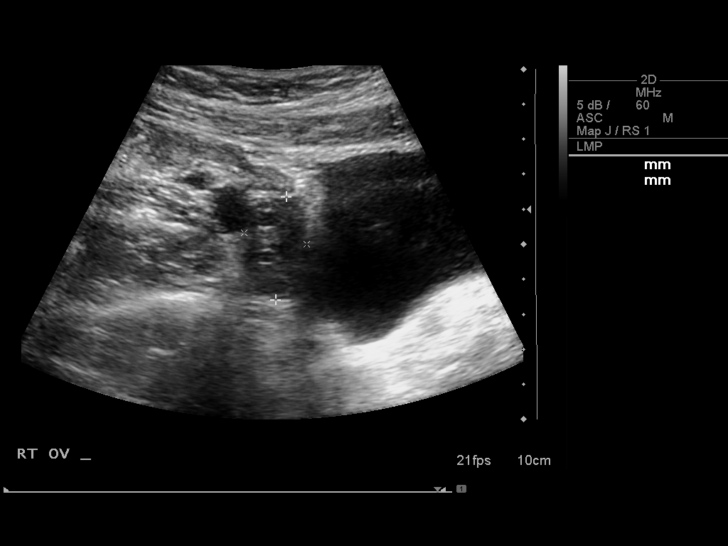
[im 14/38]
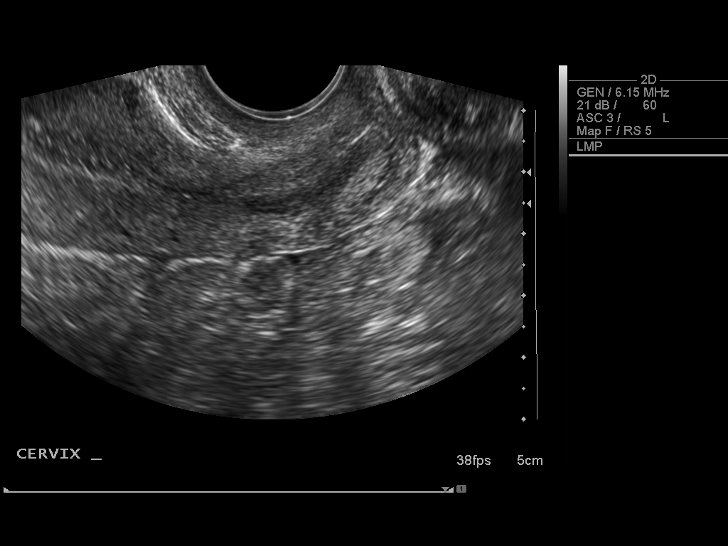
[im 17/38]
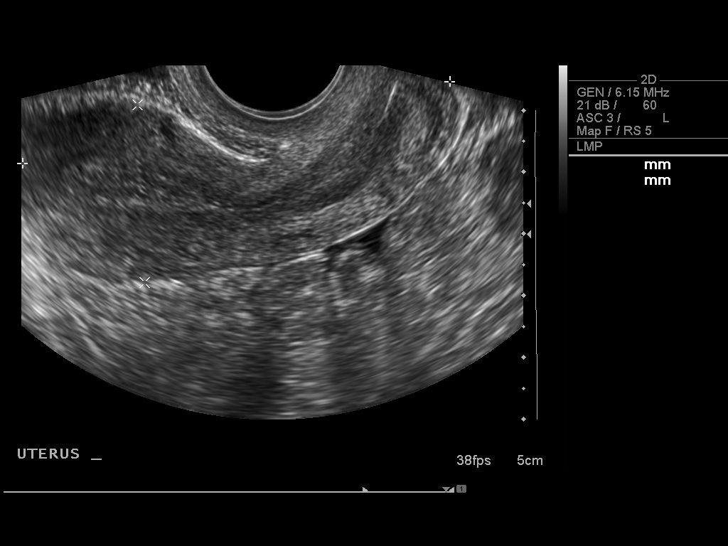
[im 21/38]
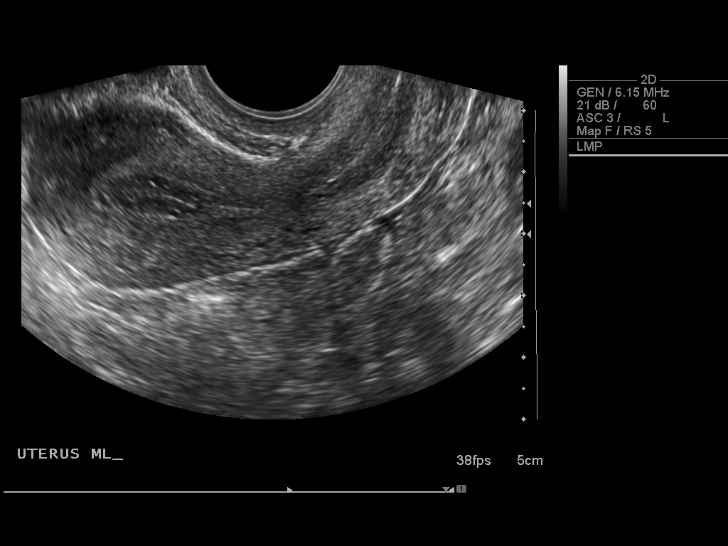
[im 24/38]
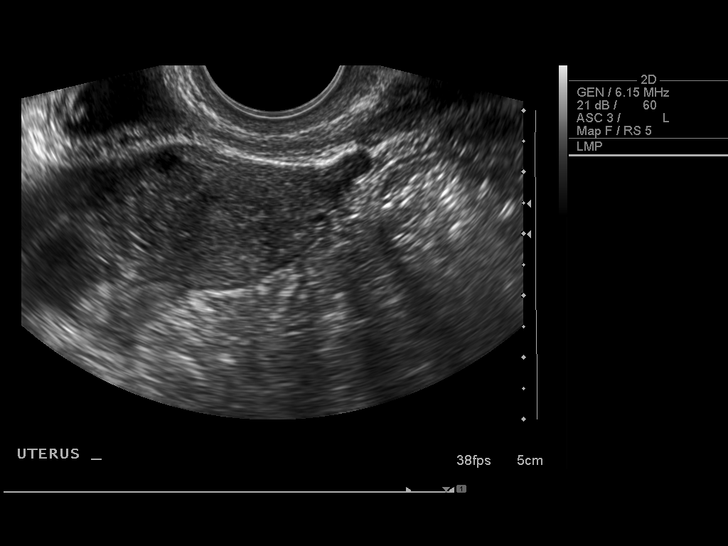
[im 25/38]
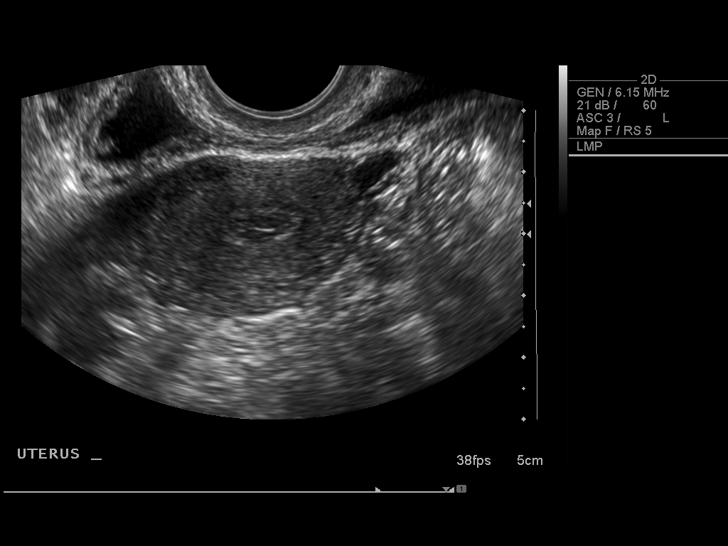
[im 28/38]
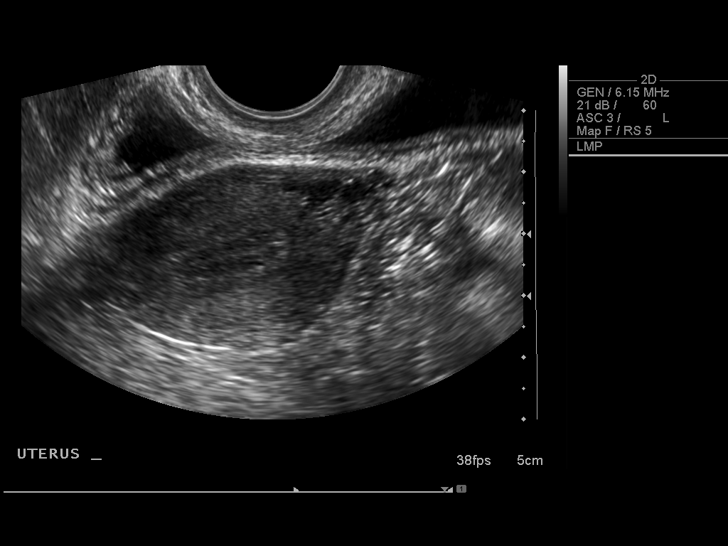
[im 31/38]
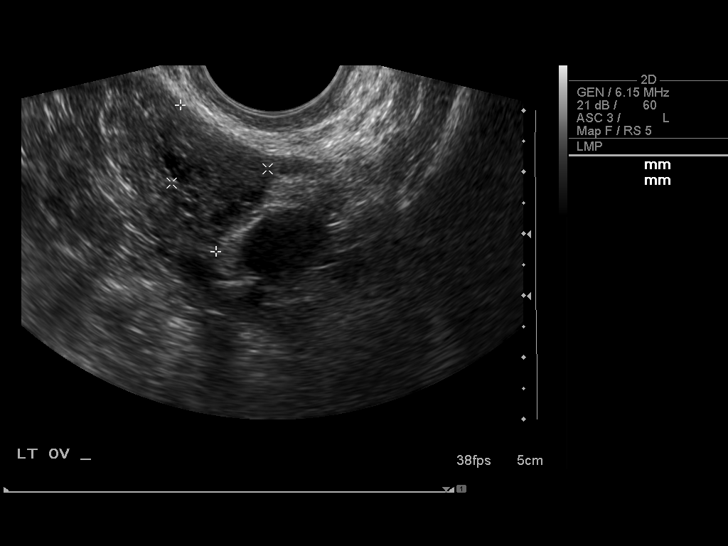
[im 34/38]
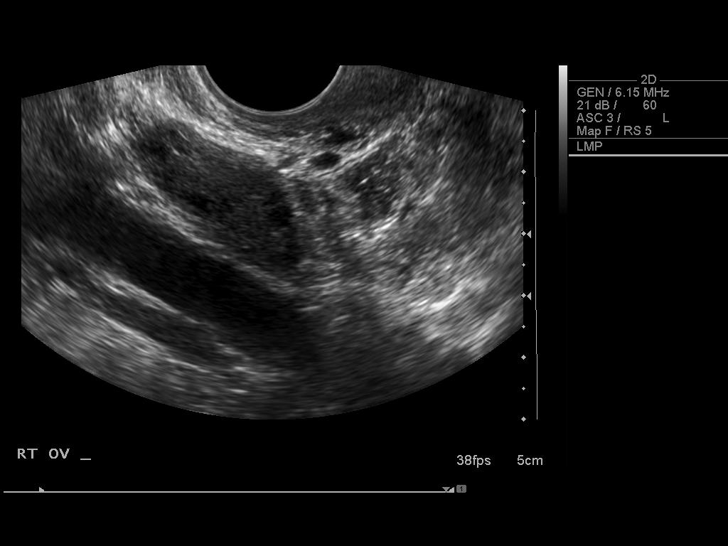
[im 38/38]
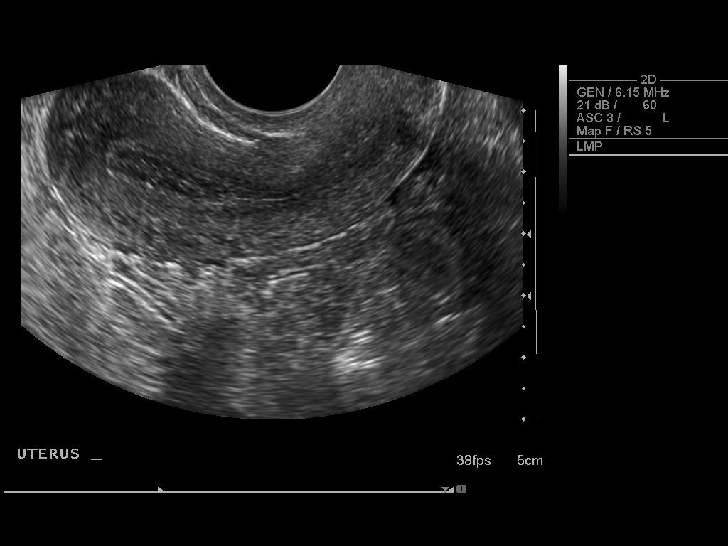

[14 of 25 positions shown; findings below may reference images not displayed]

FINDINGS: Uterus:  7.2 x 2.9 x 4.1 cm.  No fibroids or other uterine mass is
identified.

Endometrium:  8.5 mm.  No focal lesion.

Right ovary:  3.4 x 1.3 x 0.6 cm.  Normal appearance/no adnexal
mass.

Left ovary:  2.4 x 1.6 x 0.6 cm.  Normal appearance/no adnexal
mass.

Other findings:  Trace free fluid
IMPRESSION: Normal study.  No evidence of pelvic mass or other significant
abnormality.

## 2012-10-16 NOTE — Patient Instructions (Addendum)
Your procedure is scheduled on:10/17/12  Enter through the Main Entrance at :1030 am Pick up desk phone and dial [phone] and inform us of your arrival.  Please call [phone] if you have any problems the morning of surgery.  Remember: Do not eat or drink after midnight:tonight ...except water ok until 8am on Wed.   Take these meds the morning of surgery with a sip of water:none  DO NOT wear jewelry, eye make-up, lipstick,body lotion, or dark fingernail polish.   If you are to be admitted after surgery, leave suitcase in car until your room has been assigned.

## 2012-10-17 ENCOUNTER — Encounter (HOSPITAL_COMMUNITY)
Admission: RE | Disposition: A | Discharge status: Home / Self Care | Payer: Self-pay | Source: Ambulatory Visit | Attending: Obstetrics & Gynecology

## 2012-10-17 ENCOUNTER — Encounter (HOSPITAL_COMMUNITY): Payer: Self-pay | Admitting: Anesthesiology

## 2012-10-17 ENCOUNTER — Inpatient Hospital Stay (HOSPITAL_COMMUNITY): Payer: Medicaid Other | Admitting: Anesthesiology

## 2012-10-17 ENCOUNTER — Inpatient Hospital Stay (HOSPITAL_COMMUNITY)
Admission: RE | Admit: 2012-10-17 | Discharge: 2012-10-20 | DRG: 766 | Disposition: A | Discharge status: Home / Self Care | Payer: Medicaid Other | Source: Ambulatory Visit | Attending: Obstetrics & Gynecology | Admitting: Obstetrics & Gynecology

## 2012-10-17 DIAGNOSIS — O321XX0 Maternal care for breech presentation, not applicable or unspecified: Principal | ICD-10-CM | POA: Diagnosis present

## 2012-10-17 DIAGNOSIS — Z3480 Encounter for supervision of other normal pregnancy, unspecified trimester: Secondary | ICD-10-CM

## 2012-10-17 SURGERY — Surgical Case
Anesthesia: Spinal

## 2012-10-17 MED ORDER — KETOROLAC TROMETHAMINE 30 MG/ML IJ SOLN
30.0000 mg | Freq: Four times a day (QID) | INTRAMUSCULAR | Status: AC | PRN
  Filled 2012-10-17: qty 1

## 2012-10-17 MED ORDER — NALOXONE HCL 1 MG/ML IJ SOLN
1.0000 ug/kg/h | INTRAMUSCULAR | Status: DC | PRN
  Filled 2012-10-17: qty 2

## 2012-10-17 MED ORDER — IBUPROFEN 600 MG PO TABS
600.0000 mg | ORAL_TABLET | Freq: Four times a day (QID) | ORAL | Status: DC
  Administered 2012-10-18 – 2012-10-20 (×10): 600 mg via ORAL
  Filled 2012-10-17 (×12): qty 1

## 2012-10-17 MED ORDER — OXYTOCIN 10 UNIT/ML IJ SOLN
INTRAMUSCULAR | Status: AC
  Filled 2012-10-17: qty 4

## 2012-10-17 MED ORDER — DIPHENHYDRAMINE HCL 25 MG PO CAPS: 25.0000 mg | ORAL_CAPSULE | Freq: Four times a day (QID) | ORAL | Status: DC | PRN

## 2012-10-17 MED ORDER — OXYTOCIN 40 UNITS IN LACTATED RINGERS INFUSION - SIMPLE MED
INTRAVENOUS | Status: DC | PRN
  Administered 2012-10-17: 40 [IU] via INTRAVENOUS

## 2012-10-17 MED ORDER — SCOPOLAMINE 1 MG/3DAYS TD PT72
MEDICATED_PATCH | TRANSDERMAL | Status: AC
  Administered 2012-10-17: 1.5 mg via TRANSDERMAL
  Filled 2012-10-17: qty 1

## 2012-10-17 MED ORDER — WITCH HAZEL-GLYCERIN EX PADS: 1.0000 "application " | MEDICATED_PAD | CUTANEOUS | Status: DC | PRN

## 2012-10-17 MED ORDER — DIPHENHYDRAMINE HCL 25 MG PO CAPS
25.0000 mg | ORAL_CAPSULE | ORAL | Status: DC | PRN
  Filled 2012-10-17: qty 1

## 2012-10-17 MED ORDER — SENNOSIDES-DOCUSATE SODIUM 8.6-50 MG PO TABS
2.0000 | ORAL_TABLET | Freq: Every day | ORAL | Status: DC
  Administered 2012-10-19 (×2): 2 via ORAL

## 2012-10-17 MED ORDER — MEPERIDINE HCL 25 MG/ML IJ SOLN: 6.2500 mg | INTRAMUSCULAR | Status: DC | PRN

## 2012-10-17 MED ORDER — MENTHOL 3 MG MT LOZG: 1.0000 | LOZENGE | OROMUCOSAL | Status: DC | PRN

## 2012-10-17 MED ORDER — EPHEDRINE SULFATE 50 MG/ML IJ SOLN
INTRAMUSCULAR | Status: DC | PRN
  Administered 2012-10-17: 10 mg via INTRAVENOUS
  Administered 2012-10-17: 5 mg via INTRAVENOUS
  Administered 2012-10-17 (×2): 10 mg via INTRAVENOUS

## 2012-10-17 MED ORDER — DIBUCAINE 1 % RE OINT: 1.0000 "application " | TOPICAL_OINTMENT | RECTAL | Status: DC | PRN

## 2012-10-17 MED ORDER — NALOXONE HCL 0.4 MG/ML IJ SOLN: 0.4000 mg | INTRAMUSCULAR | Status: DC | PRN

## 2012-10-17 MED ORDER — LACTATED RINGERS IV SOLN
INTRAVENOUS | Status: DC
  Administered 2012-10-18: 01:00:00 via INTRAVENOUS

## 2012-10-17 MED ORDER — OXYCODONE-ACETAMINOPHEN 5-325 MG PO TABS
1.0000 | ORAL_TABLET | ORAL | Status: DC | PRN
  Administered 2012-10-18 – 2012-10-19 (×8): 1 via ORAL
  Administered 2012-10-19: 2 via ORAL
  Administered 2012-10-19 (×3): 1 via ORAL
  Administered 2012-10-20: 2 via ORAL
  Administered 2012-10-20: 1 via ORAL
  Filled 2012-10-17 (×8): qty 1
  Filled 2012-10-17: qty 2
  Filled 2012-10-17: qty 1
  Filled 2012-10-17: qty 2
  Filled 2012-10-17 (×3): qty 1

## 2012-10-17 MED ORDER — MORPHINE SULFATE (PF) 0.5 MG/ML IJ SOLN
INTRAMUSCULAR | Status: DC | PRN
  Administered 2012-10-17: .15 mg via INTRATHECAL

## 2012-10-17 MED ORDER — OXYTOCIN 40 UNITS IN LACTATED RINGERS INFUSION - SIMPLE MED: 62.5000 mL/h | INTRAVENOUS | Status: AC

## 2012-10-17 MED ORDER — KETOROLAC TROMETHAMINE 60 MG/2ML IM SOLN: 60.0000 mg | Freq: Once | INTRAMUSCULAR | Status: AC | PRN

## 2012-10-17 MED ORDER — ONDANSETRON HCL 4 MG/2ML IJ SOLN
4.0000 mg | Freq: Three times a day (TID) | INTRAMUSCULAR | Status: DC | PRN
  Administered 2012-10-17: 4 mg via INTRAVENOUS
  Filled 2012-10-17: qty 2

## 2012-10-17 MED ORDER — NALBUPHINE SYRINGE 5 MG/0.5 ML
5.0000 mg | INJECTION | INTRAMUSCULAR | Status: DC | PRN
  Filled 2012-10-17: qty 1

## 2012-10-17 MED ORDER — KETOROLAC TROMETHAMINE 60 MG/2ML IM SOLN
INTRAMUSCULAR | Status: AC
  Administered 2012-10-17: 60 mg via INTRAMUSCULAR
  Filled 2012-10-17: qty 2

## 2012-10-17 MED ORDER — FENTANYL CITRATE 0.05 MG/ML IJ SOLN
INTRAMUSCULAR | Status: DC | PRN
  Administered 2012-10-17: 25 ug via INTRATHECAL

## 2012-10-17 MED ORDER — FENTANYL CITRATE 0.05 MG/ML IJ SOLN
INTRAMUSCULAR | Status: AC
  Administered 2012-10-17: 50 ug via INTRAVENOUS
  Filled 2012-10-17: qty 2

## 2012-10-17 MED ORDER — PHENYLEPHRINE HCL 10 MG/ML IJ SOLN
INTRAMUSCULAR | Status: DC | PRN
  Administered 2012-10-17 (×2): 80 ug via INTRAVENOUS
  Administered 2012-10-17: 40 ug via INTRAVENOUS

## 2012-10-17 MED ORDER — FENTANYL CITRATE 0.05 MG/ML IJ SOLN
INTRAMUSCULAR | Status: AC
  Filled 2012-10-17: qty 2

## 2012-10-17 MED ORDER — LANOLIN HYDROUS EX OINT: 1.0000 "application " | TOPICAL_OINTMENT | CUTANEOUS | Status: DC | PRN

## 2012-10-17 MED ORDER — SIMETHICONE 80 MG PO CHEW
80.0000 mg | CHEWABLE_TABLET | ORAL | Status: DC | PRN
  Administered 2012-10-19 (×2): 80 mg via ORAL

## 2012-10-17 MED ORDER — TETANUS-DIPHTH-ACELL PERTUSSIS 5-2.5-18.5 LF-MCG/0.5 IM SUSP
0.5000 mL | Freq: Once | INTRAMUSCULAR | Status: AC
  Administered 2012-10-19: 0.5 mL via INTRAMUSCULAR

## 2012-10-17 MED ORDER — SCOPOLAMINE 1 MG/3DAYS TD PT72: 1.0000 | MEDICATED_PATCH | Freq: Once | TRANSDERMAL | Status: DC

## 2012-10-17 MED ORDER — ACETAMINOPHEN 10 MG/ML IV SOLN
1000.0000 mg | Freq: Four times a day (QID) | INTRAVENOUS | Status: AC | PRN
  Administered 2012-10-17: 1000 mg via INTRAVENOUS
  Filled 2012-10-17 (×2): qty 100

## 2012-10-17 MED ORDER — ONDANSETRON HCL 4 MG/2ML IJ SOLN
INTRAMUSCULAR | Status: AC
  Filled 2012-10-17: qty 2

## 2012-10-17 MED ORDER — EPHEDRINE 5 MG/ML INJ
INTRAVENOUS | Status: AC
  Filled 2012-10-17: qty 10

## 2012-10-17 MED ORDER — ONDANSETRON HCL 4 MG/2ML IJ SOLN: 4.0000 mg | INTRAMUSCULAR | Status: DC | PRN

## 2012-10-17 MED ORDER — CEFAZOLIN SODIUM-DEXTROSE 2-3 GM-% IV SOLR
INTRAVENOUS | Status: AC
  Filled 2012-10-17: qty 50

## 2012-10-17 MED ORDER — DIPHENHYDRAMINE HCL 50 MG/ML IJ SOLN: 25.0000 mg | INTRAMUSCULAR | Status: DC | PRN

## 2012-10-17 MED ORDER — PRENATAL MULTIVITAMIN CH
1.0000 | ORAL_TABLET | Freq: Every day | ORAL | Status: DC
  Administered 2012-10-18 – 2012-10-20 (×3): 1 via ORAL
  Filled 2012-10-17 (×3): qty 1

## 2012-10-17 MED ORDER — SIMETHICONE 80 MG PO CHEW
80.0000 mg | CHEWABLE_TABLET | Freq: Three times a day (TID) | ORAL | Status: DC
  Administered 2012-10-17 – 2012-10-20 (×9): 80 mg via ORAL

## 2012-10-17 MED ORDER — KETOROLAC TROMETHAMINE 30 MG/ML IJ SOLN
30.0000 mg | Freq: Four times a day (QID) | INTRAMUSCULAR | Status: AC | PRN
  Administered 2012-10-17: 30 mg via INTRAVENOUS

## 2012-10-17 MED ORDER — ONDANSETRON HCL 4 MG PO TABS: 4.0000 mg | ORAL_TABLET | ORAL | Status: DC | PRN

## 2012-10-17 MED ORDER — ZOLPIDEM TARTRATE 5 MG PO TABS: 5.0000 mg | ORAL_TABLET | Freq: Every evening | ORAL | Status: DC | PRN

## 2012-10-17 MED ORDER — METOCLOPRAMIDE HCL 5 MG/ML IJ SOLN: 10.0000 mg | Freq: Three times a day (TID) | INTRAMUSCULAR | Status: DC | PRN

## 2012-10-17 MED ORDER — LACTATED RINGERS IV SOLN
Freq: Once | INTRAVENOUS | Status: AC
  Administered 2012-10-17: 11:00:00 via INTRAVENOUS

## 2012-10-17 MED ORDER — FENTANYL CITRATE 0.05 MG/ML IJ SOLN: 25.0000 ug | INTRAMUSCULAR | Status: DC | PRN

## 2012-10-17 MED ORDER — PHENYLEPHRINE 40 MCG/ML (10ML) SYRINGE FOR IV PUSH (FOR BLOOD PRESSURE SUPPORT)
PREFILLED_SYRINGE | INTRAVENOUS | Status: AC
  Filled 2012-10-17: qty 5

## 2012-10-17 MED ORDER — MORPHINE SULFATE 0.5 MG/ML IJ SOLN
INTRAMUSCULAR | Status: AC
  Filled 2012-10-17: qty 10

## 2012-10-17 MED ORDER — PNEUMOCOCCAL VAC POLYVALENT 25 MCG/0.5ML IJ INJ
0.5000 mL | INJECTION | INTRAMUSCULAR | Status: AC
  Administered 2012-10-18: 0.5 mL via INTRAMUSCULAR
  Filled 2012-10-17: qty 0.5

## 2012-10-17 MED ORDER — DIPHENHYDRAMINE HCL 50 MG/ML IJ SOLN: 12.5000 mg | INTRAMUSCULAR | Status: DC | PRN

## 2012-10-17 MED ORDER — CEFAZOLIN SODIUM-DEXTROSE 2-3 GM-% IV SOLR
2.0000 g | Freq: Once | INTRAVENOUS | Status: AC
  Administered 2012-10-17: 2 g via INTRAVENOUS
  Filled 2012-10-17: qty 50

## 2012-10-17 MED ORDER — SODIUM CHLORIDE 0.9 % IJ SOLN: 3.0000 mL | INTRAMUSCULAR | Status: DC | PRN

## 2012-10-17 MED ORDER — LACTATED RINGERS IV SOLN
INTRAVENOUS | Status: DC | PRN
  Administered 2012-10-17: 12:00:00 via INTRAVENOUS

## 2012-10-17 SURGICAL SUPPLY — 35 items
CLOTH BEACON ORANGE TIMEOUT ST (SAFETY) ×2 IMPLANT
CONTAINER PREFILL 10% NBF 15ML (MISCELLANEOUS) IMPLANT
DRAPE LG THREE QUARTER DISP (DRAPES) ×2 IMPLANT
DRSG OPSITE POSTOP 4X10 (GAUZE/BANDAGES/DRESSINGS) ×2 IMPLANT
DRSG OPSITE POSTOP 4X12 (GAUZE/BANDAGES/DRESSINGS) ×1 IMPLANT
DURAPREP 26ML APPLICATOR (WOUND CARE) ×2 IMPLANT
ELECT REM PT RETURN 9FT ADLT (ELECTROSURGICAL) ×2
ELECTRODE REM PT RTRN 9FT ADLT (ELECTROSURGICAL) ×1 IMPLANT
EXTRACTOR VACUUM M CUP 4 TUBE (SUCTIONS) IMPLANT
GLOVE ECLIPSE 6.0 STRL STRAW (GLOVE) ×2 IMPLANT
GLOVE ECLIPSE 6.5 STRL STRAW (GLOVE) ×2 IMPLANT
GOWN STRL REIN XL XLG (GOWN DISPOSABLE) ×4 IMPLANT
KIT ABG SYR 3ML LUER SLIP (SYRINGE) IMPLANT
NDL HYPO 25X5/8 SAFETYGLIDE (NEEDLE) ×1 IMPLANT
NEEDLE HYPO 25X5/8 SAFETYGLIDE (NEEDLE) ×2 IMPLANT
NS IRRIG 1000ML POUR BTL (IV SOLUTION) ×2 IMPLANT
PACK C SECTION WH (CUSTOM PROCEDURE TRAY) ×2 IMPLANT
PAD OB MATERNITY 4.3X12.25 (PERSONAL CARE ITEMS) ×2 IMPLANT
RTRCTR C-SECT PINK 25CM LRG (MISCELLANEOUS) ×1 IMPLANT
SLEEVE SCD COMPRESS KNEE MED (MISCELLANEOUS) IMPLANT
STAPLER VISISTAT 35W (STAPLE) IMPLANT
SUT PLAIN 0 NONE (SUTURE) IMPLANT
SUT VIC AB 0 CT1 27 (SUTURE) ×6
SUT VIC AB 0 CT1 27XBRD ANBCTR (SUTURE) ×3 IMPLANT
SUT VIC AB 1 CTX 36 (SUTURE) ×4
SUT VIC AB 1 CTX36XBRD ANBCTRL (SUTURE) ×2 IMPLANT
SUT VIC AB 3-0 CT1 27 (SUTURE) ×2
SUT VIC AB 3-0 CT1 TAPERPNT 27 (SUTURE) ×1 IMPLANT
SUT VIC AB 3-0 PS2 18 (SUTURE)
SUT VIC AB 3-0 PS2 18XBRD (SUTURE) IMPLANT
SUT VIC AB 3-0 SH 27 (SUTURE)
SUT VIC AB 3-0 SH 27X BRD (SUTURE) IMPLANT
TOWEL OR 17X24 6PK STRL BLUE (TOWEL DISPOSABLE) ×6 IMPLANT
TRAY FOLEY CATH 14FR (SET/KITS/TRAYS/PACK) ×2 IMPLANT
WATER STERILE IRR 1000ML POUR (IV SOLUTION) ×2 IMPLANT

## 2012-10-17 NOTE — Anesthesia Postprocedure Evaluation (Signed)
  Anesthesia Post-op Note  Patient: Ashley Walls  Procedure(s) Performed: Procedure(s): CESAREAN SECTION (N/A)  Patient is awake, responsive, moving her legs, and has signs of resolution of her numbness. Pain and nausea are reasonably well controlled. Vital signs are stable and clinically acceptable. Oxygen saturation is clinically acceptable. There are no apparent anesthetic complications at this time. Patient is ready for discharge.

## 2012-10-17 NOTE — Op Note (Addendum)
Patient Name: Ashley Walls MRN: [id number]  Date of Surgery: 10/17/2012    PREOPERATIVE DIAGNOSIS: Breech Presentation [id number]  POSTOPERATIVE DIAGNOSIS: Breech Presentation [id number]   PROCEDURE: Low transverse cesarean section  SURGEON: Caralyn Guile. Arlyce Dice M.D.  ASSISTANT: Luvenia Redden, M.D.  ANESTHESIA: Spinal  ESTIMATED BLOOD LOSS: 800 ml  FINDINGS: Female, Apgar 9,9; BW 7 lbs 5 oz; normal uterus and adnexa, clear fluid.  Frank breech presentation.   INDICATIONS: Persistent Homero Fellers Breech presentation.  Failed external cephalic version on 4/21.  PROCEDURE IN DETAIL: The patient was taken to the operating room and spinal anesthesia was placed.  She was then placed in the supine position with left lateral displacement of the uterus. The abdomen was prepped and draped in a sterile fashion and the bladder was catheterized.  A low transverse abdominal incision was made and carried down to the fascia. The fascia was opened transversely and the rectus sheath was dissected from the underlying rectus muscle. The rectus midline was identified and opened by sharp and blunt dissection. The peritoneum was opened. An Alexis retractor was placed and the lower uterine segment was identified, entered transversely by careful sharp dissection, and extended bluntly.  The infant was delivered without difficulty as a frank breech. The placenta was delivered and the uterus was bluntly curettage. The lower segment was closed with running interlocking Vicryl 1 suture.  A second imbricating Vicryl 1 suture line was placed.  Hemostasis was obtained with vertical mattress sutures. The peritoneum and rectus muscle were closed in the midline with running 3-0 Vicryl suture. The fascia was closed with running 0 Vicryl suture and the skin was closed with staples. All sponge and instrument counts were correct.  The patient tolerated the procedure well and left the operating room in good condition.

## 2012-10-17 NOTE — Lactation Note (Signed)
This note was copied from the chart of Ashley Kaliann Coryell. Lactation Consultation Note  Assist with latch in PACU. Mom holding baby STS; baby sleeping and not showing hunger cues. Baby did suckle a few sucks but did not maintain a latch. Mostly baby is sleeping quietly. Discussed br feeding basics with mom and dad; mom and dad have many questions and are dedicated to br feeding. Demonstrated hand expression; colostrum easily expressed.  Lactation brochure provided, mom and dad made aware of lactation services. Enc to call for assistance if needed.  Questions answered.   Patient Name: Ashley Walls Date: 10/17/2012 Reason for consult: Initial assessment   Maternal Data Formula Feeding for Exclusion: No Infant to breast within first hour of birth: Yes Has patient been taught Hand Expression?: Yes (will need reinforcement) Does the patient have breastfeeding experience prior to this delivery?: No  Feeding Feeding Type: Breast Milk Feeding method: Breast Length of feed: 0 min  LATCH Score/Interventions Latch: Too sleepy or reluctant, no latch achieved, no sucking elicited. Intervention(s): Skin to skin  Audible Swallowing: None Intervention(s): Hand expression  Type of Nipple: Everted at rest and after stimulation  Comfort (Breast/Nipple): Soft / non-tender     Hold (Positioning): Full assist, staff holds infant at breast Intervention(s): Breastfeeding basics reviewed;Support Pillows;Position options;Skin to skin  LATCH Score: 4  Lactation Tools Discussed/Used     Consult Status Consult Status: Follow-up Follow-up type: In-patient    Octavio Manns [facility] 10/17/2012, 2:45 PM

## 2012-10-17 NOTE — Anesthesia Procedure Notes (Signed)

## 2012-10-17 NOTE — Anesthesia Preprocedure Evaluation (Addendum)
Anesthesia Evaluation  Patient identified by MRN, date of birth, ID band Patient awake    Reviewed: Allergy & Precautions, H&P , Patient's Chart, lab work & pertinent test results  Airway Mallampati: II TM Distance: >3 FB Neck ROM: full    Dental no notable dental hx.    Pulmonary  breath sounds clear to auscultation  Pulmonary exam normal       Cardiovascular Exercise Tolerance: Good Rhythm:regular Rate:Normal     Neuro/Psych    GI/Hepatic GERD-  Medicated,  Endo/Other  Morbid obesity  Renal/GU      Musculoskeletal   Abdominal   Peds  Hematology   Anesthesia Other Findings   Reproductive/Obstetrics                           Anesthesia Physical Anesthesia Plan  ASA: II  Anesthesia Plan: Spinal   Post-op Pain Management:    Induction:   Airway Management Planned:   Additional Equipment:   Intra-op Plan:   Post-operative Plan:   Informed Consent: I have reviewed the patients History and Physical, chart, labs and discussed the procedure including the risks, benefits and alternatives for the proposed anesthesia with the patient or authorized representative who has indicated his/her understanding and acceptance.   Dental Advisory Given  Plan Discussed with: CRNA  Anesthesia Plan Comments: (Lab work confirmed with CRNA in room. Platelets okay. Discussed spinal anesthetic, and patient consents to the procedure:  included risk of possible headache,backache, failed block, allergic reaction, and nerve injury. This patient was asked if she had any questions or concerns before the procedure started. )        Anesthesia Quick Evaluation

## 2012-10-17 NOTE — Anesthesia Postprocedure Evaluation (Signed)
  Anesthesia Post-op Note  Patient: Ashley Walls  Procedure(s) Performed: Procedure(s): CESAREAN SECTION (N/A)  Patient Location: Mother/Baby  Anesthesia Type:Spinal  Level of Consciousness: awake  Airway and Oxygen Therapy: Patient Spontanous Breathing  Post-op Pain: mild  Post-op Assessment: Patient's Cardiovascular Status Stable and Respiratory Function Stable  Post-op Vital Signs: stable  Complications: No apparent anesthesia complications

## 2012-10-17 NOTE — Progress Notes (Signed)
I have interviewed and performed the pertinent exams on my patient to confirm that there have been no significant changes in her condition since the dictation of her history and physical exam.  

## 2012-10-17 NOTE — Transfer of Care (Signed)
Immediate Anesthesia Transfer of Care Note  Patient: Ashley Walls  Procedure(s) Performed: Procedure(s): CESAREAN SECTION (N/A)  Patient Location: PACU  Anesthesia Type:Spinal  Level of Consciousness: awake, alert  and oriented  Airway & Oxygen Therapy: Patient Spontanous Breathing  Post-op Assessment: Report given to PACU RN and Post -op Vital signs reviewed and stable  Post vital signs: Reviewed and stable  Complications: No apparent anesthesia complications

## 2012-10-18 LAB — CBC
HCT: 32.1 % — ABNORMAL LOW (ref 36.0–46.0)
Hemoglobin: 10.4 g/dL — ABNORMAL LOW (ref 12.0–15.0)
MCH: 28.7 pg (ref 26.0–34.0)
MCHC: 32.4 g/dL (ref 30.0–36.0)
MCV: 88.4 fL (ref 78.0–100.0)
RDW: 15.2 % (ref 11.5–15.5)

## 2012-10-18 NOTE — Progress Notes (Signed)
Patient is eating, ambulating, voiding.  Pain control is good.  Appropriate lochia.  No complaints.  Filed Vitals:   10/17/12 2127 10/17/12 2340 10/18/12 0140 10/18/12 0622  BP:   115/69 118/68  Pulse: 102 110 107 102  Temp:   98 F (36.7 C) 97.8 F (36.6 C)  TempSrc:   Oral Oral  Resp: 18 20 20 20   Weight:      SpO2: 96% 97% 98% 98%    Fundus firm Perineum without swelling. Inc: c/d/i Ext: no CT  Lab Results  Component Value Date   WBC 12.3* 10/18/2012   HGB 10.4* 10/18/2012   HCT 32.1* 10/18/2012   MCV 88.4 10/18/2012   PLT 144* 10/18/2012    --/--/A POS (04/22 1230)  A/P Post op day # 1 s/p prime c/s for breech, failed version Pt doing well, circ in office planned.  Routine care.    Philip Aspen

## 2012-10-18 NOTE — Progress Notes (Signed)
UR chart review completed.  

## 2012-10-18 NOTE — Progress Notes (Signed)
CSW met with pt to assess history of depression/anxiety.  Pt's symptoms were treated with medication prior to pregnancy confirmation.  Pt states she was able to manage symptoms well without medication.  Pt told CSW that majority of her anxiety symptoms related to the "being pregnant."  She denies any depression/anxiety symptoms at this time.  She does not plan to restart the medication upon discharge however she is not opposed to seeking treatment if needed.  She denies any SI history.  Pt's mother & FOB are at the bedside & supportive.  CSW briefly discussed PP depression signs/symptoms & provided pt with literature.  Pt thanked CSW for consult & resources provided.

## 2012-10-19 ENCOUNTER — Encounter (HOSPITAL_COMMUNITY): Payer: Self-pay | Admitting: Obstetrics & Gynecology

## 2012-10-19 NOTE — Progress Notes (Signed)
POD#2 Pt without complaints. VSSAF IMP/ doing well. PLAN/ routine care.

## 2012-10-20 MED ORDER — OXYCODONE-ACETAMINOPHEN 5-325 MG PO TABS: 1.0000 | ORAL_TABLET | ORAL | Status: DC | PRN

## 2012-10-20 MED ORDER — IBUPROFEN 600 MG PO TABS: 600.0000 mg | ORAL_TABLET | Freq: Four times a day (QID) | ORAL | Status: DC

## 2012-10-20 NOTE — Discharge Summary (Signed)
Obstetric Discharge Summary Reason for Admission: cesarean section Prenatal Procedures: ultrasound Intrapartum Procedures: cesarean: low cervical, transverse Postpartum Procedures: none Complications-Operative and Postpartum: none Hemoglobin  Date Value Range Status  10/18/2012 10.4* 12.0 - 15.0 g/dL Final     DELTA CHECK NOTED     REPEATED TO VERIFY     HCT  Date Value Range Status  10/18/2012 32.1* 36.0 - 46.0 % Final    Physical Exam:  General: alert Lochia: appropriate Uterine Fundus: firm Incision: healing well DVT Evaluation: No evidence of DVT seen on physical exam.  Discharge Diagnoses: Term Pregnancy-delivered and Breech  Discharge Information: Date: 10/20/2012 Activity: pelvic rest Diet: routine Medications: PNV, Ibuprofen and Percocet Condition: stable Instructions: refer to practice specific booklet Discharge to: home Follow-up Information   Follow up with Mickel Baas, MD. Schedule an appointment as soon as possible for a visit in 4 weeks.   Contact information:   [address] [place] [address] [phone]       Newborn Data: Live born female  Birth Weight: 7 lb 5.1 oz (3320 g) APGAR: 8, 9  Home with mother.  Chelise Hanger E 10/20/2012, 11:02 AM

## 2012-10-20 NOTE — Progress Notes (Signed)
Staples removed per md order, steri strips applied. Patient tolerated well.

## 2012-10-20 NOTE — Progress Notes (Signed)
POD#3 Pt without complaints. Will discharge to home.

## 2012-10-23 ENCOUNTER — Ambulatory Visit (HOSPITAL_COMMUNITY): Admit: 2012-10-23 | Payer: Medicaid Other

## 2012-10-30 NOTE — MAU Note (Signed)
Chart audited for monthly QI 

## 2012-12-05 ENCOUNTER — Emergency Department (INDEPENDENT_AMBULATORY_CARE_PROVIDER_SITE_OTHER)
Admission: EM | Admit: 2012-12-05 | Discharge: 2012-12-05 | Disposition: A | Discharge status: Home / Self Care | Payer: Medicaid Other | Source: Home / Self Care | Attending: Family Medicine | Admitting: Family Medicine

## 2012-12-05 ENCOUNTER — Encounter (HOSPITAL_COMMUNITY): Payer: Self-pay | Admitting: *Deleted

## 2012-12-05 ENCOUNTER — Emergency Department (INDEPENDENT_AMBULATORY_CARE_PROVIDER_SITE_OTHER): Payer: Medicaid Other

## 2012-12-05 DIAGNOSIS — M940 Chondrocostal junction syndrome [Tietze]: Secondary | ICD-10-CM

## 2012-12-05 DIAGNOSIS — J069 Acute upper respiratory infection, unspecified: Secondary | ICD-10-CM

## 2012-12-05 LAB — COMPREHENSIVE METABOLIC PANEL
Albumin: 3.8 g/dL (ref 3.5–5.2)
BUN: 8 mg/dL (ref 6–23)
Chloride: 104 mEq/L (ref 96–112)
Creatinine, Ser: 0.65 mg/dL (ref 0.50–1.10)
Total Bilirubin: 0.3 mg/dL (ref 0.3–1.2)
Total Protein: 7.1 g/dL (ref 6.0–8.3)

## 2012-12-05 LAB — POCT RAPID STREP A: Streptococcus, Group A Screen (Direct): NEGATIVE

## 2012-12-05 LAB — CBC WITH DIFFERENTIAL/PLATELET
Eosinophils Relative: 4 % (ref 0–5)
Lymphocytes Relative: 18 % (ref 12–46)
Lymphs Abs: 1.6 10*3/uL (ref 0.7–4.0)
MCV: 86.7 fL (ref 78.0–100.0)
Platelets: 235 10*3/uL (ref 150–400)
RBC: 4.52 MIL/uL (ref 3.87–5.11)
WBC: 9 10*3/uL (ref 4.0–10.5)

## 2012-12-05 MED ORDER — BENZONATATE 200 MG PO CAPS: 200.0000 mg | ORAL_CAPSULE | Freq: Three times a day (TID) | ORAL | Status: DC | PRN

## 2012-12-05 NOTE — ED Notes (Signed)
Pt      Reports       Cough   And  Chest  Wall pain  When  She  Takes  A  Deep  Breath   Pt  Reports  The  Cough  Is  Productive   She  Is  A  Smoker  -  She  Is  Breastfeeding   She  Is  Sitting  Upright on  Exam table   Speaking  In   Complete  sentances

## 2012-12-05 NOTE — ED Provider Notes (Signed)
History     CSN: [id number]  Arrival date & time 12/05/12  1250   First MD Initiated Contact with Patient 12/05/12 1316      Chief Complaint  Patient presents with  . Cough    (Consider location/radiation/quality/duration/timing/severity/associated sxs/prior treatment) HPI Comments: 22 year old female presents complaining of pleuritic chest pain. This began 3 days ago with a sore throat. Since then, she has started to have a burning chest pain and feeling short of breath. She has a constant substernal chest pain that burns and radiates bilaterally whenever she coughs. Patient does smoke syrups. She does not take birth control and has no history of DVT or pulmonary embolism.    Patient is a 22 y.o. female presenting with cough.  Cough Associated symptoms: chest pain, shortness of breath and sore throat   Associated symptoms: no chills, no ear pain, no fever, no myalgias, no rash, no rhinorrhea and no wheezing     Past Medical History  Diagnosis Date  . Anxiety   . Depression   . GERD (gastroesophageal reflux disease)   . Ovarian cyst   . Pregnant   . YQMVHQIO(962.9)     Past Surgical History  Procedure Laterality Date  . Root canal    . No past surgeries    . Cesarean section N/A 10/17/2012    Procedure: CESAREAN SECTION;  Surgeon: Mickel Baas, MD;  Location: [facility];  Service: Obstetrics;  Laterality: N/A;    Family History  Problem Relation Age of Onset  . COPD Mother   . Asthma Mother   . Depression Mother   . Hypertension Mother   . Diabetes Father   . Alcohol abuse Father   . Depression Maternal Aunt   . Diabetes Maternal Uncle   . Heart disease Maternal Grandmother   . Hypertension Maternal Grandmother   . Osteoporosis Paternal Grandmother     History  Substance Use Topics  . Smoking status: Current Every Day Smoker    Last Attempt to Quit: 02/19/2012  . Smokeless tobacco: Never Used  . Alcohol Use: No    OB History   Grav Para Term Preterm  Abortions TAB SAB Ect Mult Living   2 1 1  0 1 0 1 0 0 1      Review of Systems  Constitutional: Negative for fever and chills.  HENT: Positive for sore throat. Negative for ear pain, rhinorrhea, sneezing, trouble swallowing, voice change and postnasal drip.   Eyes: Negative for visual disturbance.  Respiratory: Positive for cough, chest tightness and shortness of breath. Negative for wheezing.   Cardiovascular: Positive for chest pain. Negative for palpitations and leg swelling.  Gastrointestinal: Negative for nausea, vomiting and abdominal pain.  Endocrine: Negative for polydipsia and polyuria.  Genitourinary: Negative for dysuria, urgency and frequency.  Musculoskeletal: Negative for myalgias and arthralgias.  Skin: Negative for rash.  Neurological: Negative for dizziness, weakness and light-headedness.    Allergies  Review of patient's allergies indicates no known allergies.  Home Medications   Current Outpatient Rx  Name  Route  Sig  Dispense  Refill  . acetaminophen (TYLENOL) 325 MG tablet   Oral   Take 650 mg by mouth daily as needed. For pain         . benzonatate (TESSALON) 200 MG capsule   Oral   Take 1 capsule (200 mg total) by mouth 3 (three) times daily as needed for cough.   60 capsule   0   . ibuprofen (ADVIL,MOTRIN) 600 MG  tablet   Oral   Take 1 tablet (600 mg total) by mouth every 6 (six) hours.   30 tablet   0   . oxyCODONE-acetaminophen (PERCOCET/ROXICET) 5-325 MG per tablet   Oral   Take 1-2 tablets by mouth every 4 (four) hours as needed.   30 tablet   0   . Prenatal Vit-Fe Fumarate-FA (PRENATAL MULTIVITAMIN) TABS   Oral   Take 1 tablet by mouth daily.           BP 125/79  Pulse 108  Temp(Src) 98.8 F (37.1 C) (Oral)  Resp 16  SpO2 99%  LMP 11/21/2012  Breastfeeding? Yes  Physical Exam  Nursing note and vitals reviewed. Constitutional: She is oriented to person, place, and time. Vital signs are normal. She appears well-developed  and well-nourished. No distress.  HENT:  Head: Atraumatic.  Eyes: EOM are normal. Pupils are equal, round, and reactive to light.  Cardiovascular: Regular rhythm and normal heart sounds.  Tachycardia present.  Exam reveals no gallop and no friction rub.   No murmur heard. Pulmonary/Chest: Breath sounds normal. No accessory muscle usage. Not tachypneic. No respiratory distress. She has no decreased breath sounds. She has no wheezes. She has no rales. She exhibits tenderness (Chest pain is reproducible with AP compression of the sternum.  Lateral compression of the chest causes increased SOB.  ). She exhibits no retraction.  Abdominal: Soft. There is no hepatosplenomegaly. There is tenderness in the right upper quadrant. There is positive Murphy's sign (minimally positive). There is no rebound, no guarding and no CVA tenderness.  Neurological: She is alert and oriented to person, place, and time. She has normal strength.  Skin: Skin is warm and dry. She is not diaphoretic.  Psychiatric: She has a normal mood and affect. Her behavior is normal. Judgment normal.    ED Course  Procedures (including critical care time)  Labs Reviewed  COMPREHENSIVE METABOLIC PANEL - Abnormal; Notable for the following:    Alkaline Phosphatase 132 (*)    All other components within normal limits  CULTURE, GROUP A STREP  D-DIMER, QUANTITATIVE  CBC WITH DIFFERENTIAL  POCT RAPID STREP A ([facility] CARE ONLY)   Dg Chest 2 View  12/05/2012   *RADIOLOGY REPORT*  Clinical Data: Chest pain and coughing.  CHEST - 2 VIEW  Comparison: None.  Findings: Trachea is midline.  Heart size normal.  Lungs are clear. No pleural fluid.  IMPRESSION: No acute findings.   Original Report Authenticated By: Leanna Battles, M.D.     1. URI (upper respiratory infection)   2. Costochondritis, acute       MDM  Ruled out PE with a negative d-dimer. There are no acute abnormalities on the EKG, and chest x-ray is normal. There is a mild  elevation in alkaline phosphatase-she will return if she begins to develop worsening abdominal pain. She will call her OB pediatrician's office for recommendations on symptomatic management of her cough her breast feeding.    Meds ordered this encounter  Medications  . benzonatate (TESSALON) 200 MG capsule    Sig: Take 1 capsule (200 mg total) by mouth 3 (three) times daily as needed for cough.    Dispense:  60 capsule    Refill:  0           Graylon Good, PA-C 12/05/12 1530  Graylon Good, PA-C 12/05/12 1552

## 2012-12-07 LAB — CULTURE, GROUP A STREP

## 2012-12-12 NOTE — ED Provider Notes (Signed)
Medical screening examination/treatment/procedure(s) were performed by non-physician practitioner and as supervising physician I was immediately available for consultation/collaboration.   MORENO-COLL,Izabella Marcantel; MD  Ithan Touhey Moreno-Coll, MD 12/12/12 0947 

## 2013-01-19 ENCOUNTER — Emergency Department (HOSPITAL_BASED_OUTPATIENT_CLINIC_OR_DEPARTMENT_OTHER)
Admission: EM | Admit: 2013-01-19 | Discharge: 2013-01-19 | Disposition: A | Discharge status: Home / Self Care | Payer: Medicaid Other | Attending: Emergency Medicine | Admitting: Emergency Medicine

## 2013-01-19 ENCOUNTER — Encounter (HOSPITAL_BASED_OUTPATIENT_CLINIC_OR_DEPARTMENT_OTHER): Payer: Self-pay | Admitting: Emergency Medicine

## 2013-01-19 DIAGNOSIS — Z8659 Personal history of other mental and behavioral disorders: Secondary | ICD-10-CM | POA: Insufficient documentation

## 2013-01-19 DIAGNOSIS — R112 Nausea with vomiting, unspecified: Secondary | ICD-10-CM | POA: Insufficient documentation

## 2013-01-19 DIAGNOSIS — Z8742 Personal history of other diseases of the female genital tract: Secondary | ICD-10-CM | POA: Insufficient documentation

## 2013-01-19 DIAGNOSIS — Z3202 Encounter for pregnancy test, result negative: Secondary | ICD-10-CM | POA: Insufficient documentation

## 2013-01-19 DIAGNOSIS — R109 Unspecified abdominal pain: Secondary | ICD-10-CM | POA: Insufficient documentation

## 2013-01-19 DIAGNOSIS — F17200 Nicotine dependence, unspecified, uncomplicated: Secondary | ICD-10-CM | POA: Insufficient documentation

## 2013-01-19 DIAGNOSIS — R197 Diarrhea, unspecified: Secondary | ICD-10-CM | POA: Insufficient documentation

## 2013-01-19 DIAGNOSIS — Z79899 Other long term (current) drug therapy: Secondary | ICD-10-CM | POA: Insufficient documentation

## 2013-01-19 DIAGNOSIS — Z8719 Personal history of other diseases of the digestive system: Secondary | ICD-10-CM | POA: Insufficient documentation

## 2013-01-19 LAB — COMPREHENSIVE METABOLIC PANEL
ALT: 24 U/L (ref 0–35)
AST: 16 U/L (ref 0–37)
Calcium: 9.3 mg/dL (ref 8.4–10.5)
Creatinine, Ser: 0.6 mg/dL (ref 0.50–1.10)
GFR calc Af Amer: >90 mL/min (ref >90)
Glucose, Bld: 88 mg/dL (ref 70–99)
Sodium: 138 mEq/L (ref 135–145)
Total Protein: 6.7 g/dL (ref 6.0–8.3)

## 2013-01-19 LAB — URINE MICROSCOPIC-ADD ON

## 2013-01-19 LAB — CBC WITH DIFFERENTIAL/PLATELET
Basophils Absolute: 0 10*3/uL (ref 0.0–0.1)
Eosinophils Absolute: 0.3 10*3/uL (ref 0.0–0.7)
Eosinophils Relative: 4 % (ref 0–5)
MCH: 29.2 pg (ref 26.0–34.0)
MCHC: 33.1 g/dL (ref 30.0–36.0)
MCV: 88.3 fL (ref 78.0–100.0)
Monocytes Absolute: 0.9 10*3/uL (ref 0.1–1.0)
Platelets: 229 10*3/uL (ref 150–400)
RDW: 13.5 % (ref 11.5–15.5)

## 2013-01-19 LAB — URINALYSIS, ROUTINE W REFLEX MICROSCOPIC
Bilirubin Urine: NEGATIVE
Ketones, ur: NEGATIVE mg/dL
Nitrite: NEGATIVE
Protein, ur: NEGATIVE mg/dL
Specific Gravity, Urine: 1.023 (ref 1.005–1.030)
Urobilinogen, UA: 1 mg/dL (ref 0.0–1.0)

## 2013-01-19 MED ORDER — PANTOPRAZOLE SODIUM 40 MG IV SOLR
40.0000 mg | Freq: Once | INTRAVENOUS | Status: AC
  Administered 2013-01-19: 40 mg via INTRAVENOUS
  Filled 2013-01-19: qty 40

## 2013-01-19 MED ORDER — ONDANSETRON HCL 4 MG PO TABS: 4.0000 mg | ORAL_TABLET | Freq: Four times a day (QID) | ORAL | Status: DC

## 2013-01-19 MED ORDER — SODIUM CHLORIDE 0.9 % IV BOLUS (SEPSIS)
1000.0000 mL | Freq: Once | INTRAVENOUS | Status: AC
  Administered 2013-01-19: 1000 mL via INTRAVENOUS

## 2013-01-19 MED ORDER — ONDANSETRON HCL 4 MG/2ML IJ SOLN
4.0000 mg | Freq: Once | INTRAMUSCULAR | Status: AC
  Administered 2013-01-19: 4 mg via INTRAVENOUS
  Filled 2013-01-19: qty 2

## 2013-01-19 NOTE — ED Provider Notes (Signed)
CSN: [id number]     Arrival date & time 01/19/13  1529 History     First MD Initiated Contact with Patient 01/19/13 1539     Chief Complaint  Patient presents with  . Emesis   (Consider location/radiation/quality/duration/timing/severity/associated sxs/prior Treatment) HPI  22 year old female with history of anxiety, depression, and GERD presents complaining of abdominal discomfort. Patient state she thinks she had food poisoning due to eating some old chicken salad last night.  States a few hours after eating she developed abdominal cramping, she went to sleep, woke up in the middle of the night feeling nauseous, had multiple bouts of nonbloody, nonbilious vomit, and persistent nonbloody, non-mucousy diarrhea. abd pain is to epigastric and LLQ, pain is non radiating.  Has appetite.  Patient denies taking Pepto-Bismol and Imodium with some relief. Symptom has been improving throughout the day but she is currently breast-feeding her 64 month old child, so she decided to come to the ER for further evaluation. Otherwise patient denies fever, chills, headache, chest pain, shortness of breath, back pain, dysuria, hematuria, or rash.  Past Medical History  Diagnosis Date  . Anxiety   . Depression   . GERD (gastroesophageal reflux disease)   . Ovarian cyst   . Pregnant   . XLKGMWNU(272.5)    Past Surgical History  Procedure Laterality Date  . Root canal    . No past surgeries    . Cesarean section N/A 10/17/2012    Procedure: CESAREAN SECTION;  Surgeon: Mickel Baas, MD;  Location: [facility];  Service: Obstetrics;  Laterality: N/A;   Family History  Problem Relation Age of Onset  . COPD Mother   . Asthma Mother   . Depression Mother   . Hypertension Mother   . Diabetes Father   . Alcohol abuse Father   . Depression Maternal Aunt   . Diabetes Maternal Uncle   . Heart disease Maternal Grandmother   . Hypertension Maternal Grandmother   . Osteoporosis Paternal Grandmother    History   Substance Use Topics  . Smoking status: Current Every Day Smoker -- 0.50 packs/day    Types: Cigarettes    Last Attempt to Quit: 02/19/2012  . Smokeless tobacco: Never Used  . Alcohol Use: No   OB History   Grav Para Term Preterm Abortions TAB SAB Ect Mult Living   2 1 1  0 1 0 1 0 0 1     Review of Systems  All other systems reviewed and are negative.    Allergies  Review of patient's allergies indicates no known allergies.  Home Medications   Current Outpatient Rx  Name  Route  Sig  Dispense  Refill  . acetaminophen (TYLENOL) 325 MG tablet   Oral   Take 650 mg by mouth daily as needed. For pain         . benzonatate (TESSALON) 200 MG capsule   Oral   Take 1 capsule (200 mg total) by mouth 3 (three) times daily as needed for cough.   60 capsule   0   . ibuprofen (ADVIL,MOTRIN) 600 MG tablet   Oral   Take 1 tablet (600 mg total) by mouth every 6 (six) hours.   30 tablet   0   . oxyCODONE-acetaminophen (PERCOCET/ROXICET) 5-325 MG per tablet   Oral   Take 1-2 tablets by mouth every 4 (four) hours as needed.   30 tablet   0   . Prenatal Vit-Fe Fumarate-FA (PRENATAL MULTIVITAMIN) TABS   Oral  Take 1 tablet by mouth daily.          BP 143/95  Pulse 113  Temp(Src) 99.1 F (37.3 C) (Oral)  Resp 18  SpO2 98%  LMP 01/17/2013  Breastfeeding? Yes Physical Exam  Nursing note and vitals reviewed. Constitutional: She appears well-developed and well-nourished. No distress.  Awake, alert, nontoxic appearance  HENT:  Head: Atraumatic.  Mouth/Throat: Oropharynx is clear and moist.  Eyes: Conjunctivae are normal. Right eye exhibits no discharge. Left eye exhibits no discharge.  Neck: Neck supple.  Cardiovascular: Normal rate and regular rhythm.   Pulmonary/Chest: Effort normal. No respiratory distress. She exhibits no tenderness.  Abdominal: Soft. There is tenderness (mild epigastric tenderness left lower quadrant tenderness on palpation without guarding or  rebound tenderness.). There is no rebound.  No Murphy's sign, no McBurney's point.  Musculoskeletal: She exhibits no tenderness.  ROM appears intact, no obvious focal weakness  Neurological:  Mental status and motor strength appears intact  Skin: No rash noted.  Psychiatric: She has a normal mood and affect.    ED Course   Procedures (including critical care time)  3:58 PM Pt with abd pain and n/v/d suggestive of gastroenteritis.  Doubt cardiopulmonary disease.  Doubt gallbladder, pancreatitis, colitis, or hepatitis.  Work up initiated.    5:03 PM preg test neg, labs are reassuring.  mildlly elevated alk phos of 124, lower than previous measurement, normal lipase.   6:13 PM The patient felt much better after receiving IV fluid and medication. She is able to tolerates by mouth. She is currently stable for discharge. Since patient is breast feeding, I recommend pump and dump for today.  Will d/c with zofran and return precaution given.    Labs Reviewed  URINALYSIS, ROUTINE W REFLEX MICROSCOPIC - Abnormal; Notable for the following:    Leukocytes, UA SMALL (*)    All other components within normal limits  COMPREHENSIVE METABOLIC PANEL - Abnormal; Notable for the following:    Alkaline Phosphatase 124 (*)    All other components within normal limits  URINE MICROSCOPIC-ADD ON - Abnormal; Notable for the following:    Bacteria, UA FEW (*)    All other components within normal limits  URINE CULTURE  PREGNANCY, URINE  CBC WITH DIFFERENTIAL  LIPASE, BLOOD   No results found. 1. Nausea vomiting and diarrhea     MDM  BP 111/64  Pulse 85  Temp(Src) 99.1 F (37.3 C) (Oral)  Resp 16  SpO2 100%  LMP 01/17/2013  Breastfeeding? Yes   Fayrene Helper, PA-C 01/19/13 1816

## 2013-01-19 NOTE — ED Provider Notes (Signed)
Medical screening examination/treatment/procedure(s) were performed by non-physician practitioner and as supervising physician I was immediately available for consultation/collaboration.   Lauralynn Loeb, MD 01/19/13 2346 

## 2013-01-19 NOTE — ED Notes (Signed)
Pt sts vomiting started 0300 this am; thinks may be food poisoning d/t some chicken salad last night around 2230- stomach hurt mildly last night, but like this am. + Diarrhea +Peptobismol with immodium which has helped mildly for a sure time.

## 2013-01-19 NOTE — ED Notes (Signed)
Greta Doom, PA-C at bedside.

## 2013-01-19 NOTE — ED Notes (Signed)
Previous disposition documentation and care handoff is an error. Patient still in department receiving treatment.

## 2013-01-21 LAB — URINE CULTURE

## 2013-04-19 ENCOUNTER — Encounter (HOSPITAL_COMMUNITY): Payer: Self-pay | Admitting: Emergency Medicine

## 2013-04-19 ENCOUNTER — Emergency Department (INDEPENDENT_AMBULATORY_CARE_PROVIDER_SITE_OTHER)
Admission: EM | Admit: 2013-04-19 | Discharge: 2013-04-19 | Disposition: A | Discharge status: Home / Self Care | Payer: Medicaid Other | Source: Home / Self Care

## 2013-04-19 DIAGNOSIS — J45909 Unspecified asthma, uncomplicated: Secondary | ICD-10-CM

## 2013-04-19 DIAGNOSIS — J069 Acute upper respiratory infection, unspecified: Secondary | ICD-10-CM

## 2013-04-19 MED ORDER — IPRATROPIUM BROMIDE 0.02 % IN SOLN
RESPIRATORY_TRACT | Status: AC
  Filled 2013-04-19: qty 2.5

## 2013-04-19 MED ORDER — TRIAMCINOLONE ACETONIDE 40 MG/ML IJ SUSP
INTRAMUSCULAR | Status: AC
  Filled 2013-04-19: qty 1

## 2013-04-19 MED ORDER — BECLOMETHASONE DIPROPIONATE 40 MCG/ACT IN AERS: 2.0000 | INHALATION_SPRAY | Freq: Two times a day (BID) | RESPIRATORY_TRACT | Status: DC

## 2013-04-19 MED ORDER — IPRATROPIUM BROMIDE 0.02 % IN SOLN
0.5000 mg | Freq: Once | RESPIRATORY_TRACT | Status: AC
  Administered 2013-04-19: 0.5 mg via RESPIRATORY_TRACT

## 2013-04-19 MED ORDER — ALBUTEROL SULFATE (5 MG/ML) 0.5% IN NEBU
INHALATION_SOLUTION | RESPIRATORY_TRACT | Status: AC
  Filled 2013-04-19: qty 1

## 2013-04-19 MED ORDER — TRIAMCINOLONE ACETONIDE 40 MG/ML IJ SUSP
40.0000 mg | Freq: Once | INTRAMUSCULAR | Status: AC
  Administered 2013-04-19: 40 mg via INTRAMUSCULAR

## 2013-04-19 MED ORDER — PHENYLEPHRINE-CHLORPHEN-DM 10-4-12.5 MG/5ML PO LIQD: 5.0000 mL | ORAL | Status: DC | PRN

## 2013-04-19 MED ORDER — METHYLPREDNISOLONE 4 MG PO KIT: PACK | ORAL | Status: DC

## 2013-04-19 MED ORDER — ALBUTEROL SULFATE (5 MG/ML) 0.5% IN NEBU
5.0000 mg | INHALATION_SOLUTION | Freq: Once | RESPIRATORY_TRACT | Status: AC
  Administered 2013-04-19: 5 mg via RESPIRATORY_TRACT

## 2013-04-19 MED ORDER — ALBUTEROL SULFATE HFA 108 (90 BASE) MCG/ACT IN AERS: 2.0000 | INHALATION_SPRAY | RESPIRATORY_TRACT | Status: DC | PRN

## 2013-04-19 NOTE — ED Notes (Signed)
C/o cold symptoms; fever, headaches, runny nose and productive cough.  Cough medication was used but no relief.

## 2013-04-19 NOTE — ED Provider Notes (Signed)
Medical screening examination/treatment/procedure(s) were performed by a resident physician or non-physician practitioner and as the supervising physician I was immediately available for consultation/collaboration.  Clementeen Graham, MD   Rodolph Bong, MD 04/19/13 [phone]

## 2013-04-19 NOTE — ED Provider Notes (Signed)
CSN: [id number]     Arrival date & time 04/19/13  1240 History   First MD Initiated Contact with Patient 04/19/13 1316     Chief Complaint  Patient presents with  . URI   (Consider location/radiation/quality/duration/timing/severity/associated sxs/prior Treatment) HPI Comments:  22 -year-old female with a three-day history of cough, headache, runny nose, PND and a fever of 99.8 yesterday. She has taken is some sort of cough medicine but it did not help. She smokes "only a little bit".   Past Medical History  Diagnosis Date  . Anxiety   . Depression   . GERD (gastroesophageal reflux disease)   . Ovarian cyst   . Pregnant   . [id number](478.2)    Past Surgical History  Procedure Laterality Date  . Root canal    . No past surgeries    . Cesarean section N/A 10/17/2012    Procedure: CESAREAN SECTION;  Surgeon: Mickel Baas, MD;  Location: [facility];  Service: Obstetrics;  Laterality: N/A;   Family History  Problem Relation Age of Onset  . COPD Mother   . Asthma Mother   . Depression Mother   . Hypertension Mother   . Diabetes Father   . Alcohol abuse Father   . Depression Maternal Aunt   . Diabetes Maternal Uncle   . Heart disease Maternal Grandmother   . Hypertension Maternal Grandmother   . Osteoporosis Paternal Grandmother    History  Substance Use Topics  . Smoking status: Current Every Day Smoker -- 0.50 packs/day    Types: Cigarettes    Last Attempt to Quit: 02/19/2012  . Smokeless tobacco: Never Used  . Alcohol Use: No   OB History   Grav Para Term Preterm Abortions TAB SAB Ect Mult Living   2 1 1  0 1 0 1 0 0 1     Review of Systems  Constitutional: Positive for fever and activity change. Negative for chills, appetite change and fatigue.  HENT: Positive for congestion, postnasal drip, rhinorrhea and sore throat. Negative for facial swelling and trouble swallowing.   Eyes: Negative.   Respiratory: Positive for cough. Negative for chest tightness, shortness  of breath and wheezing.   Cardiovascular: Negative.   Genitourinary: Negative.   Musculoskeletal: Negative for neck pain and neck stiffness.  Skin: Negative for pallor and rash.  Neurological: Positive for headaches. Negative for tremors, seizures, syncope, facial asymmetry and speech difficulty.    Allergies  Review of patient's allergies indicates no known allergies.  Home Medications   Current Outpatient Rx  Name  Route  Sig  Dispense  Refill  . acetaminophen (TYLENOL) 325 MG tablet   Oral   Take 650 mg by mouth daily as needed. For pain         . albuterol (PROVENTIL HFA;VENTOLIN HFA) 108 (90 BASE) MCG/ACT inhaler   Inhalation   Inhale 2 puffs into the lungs every 4 (four) hours as needed for wheezing.   1 Inhaler   0   . beclomethasone (QVAR) 40 MCG/ACT inhaler   Inhalation   Inhale 2 puffs into the lungs 2 (two) times daily.   1 Inhaler   12   . benzonatate (TESSALON) 200 MG capsule   Oral   Take 1 capsule (200 mg total) by mouth 3 (three) times daily as needed for cough.   60 capsule   0   . ibuprofen (ADVIL,MOTRIN) 600 MG tablet   Oral   Take 1 tablet (600 mg total) by mouth every 6 (six)  hours.   30 tablet   0   . methylPREDNISolone (MEDROL DOSEPAK) 4 MG tablet      follow package directions   21 tablet   0   . ondansetron (ZOFRAN) 4 MG tablet   Oral   Take 1 tablet (4 mg total) by mouth every 6 (six) hours.   12 tablet   0   . oxyCODONE-acetaminophen (PERCOCET/ROXICET) 5-325 MG per tablet   Oral   Take 1-2 tablets by mouth every 4 (four) hours as needed.   30 tablet   0   . Phenylephrine-Chlorphen-DM 03-31-11.5 MG/5ML LIQD   Oral   Take 5 mLs by mouth every 4 (four) hours as needed.   120 mL   0   . Prenatal Vit-Fe Fumarate-FA (PRENATAL MULTIVITAMIN) TABS   Oral   Take 1 tablet by mouth daily.          BP 133/83  Pulse 82  Temp(Src) 98 F (36.7 C) (Oral)  Resp 16  SpO2 96%  LMP 04/05/2013  Breastfeeding? No Physical Exam   Nursing note and vitals reviewed. Constitutional: She is oriented to person, place, and time. She appears well-developed and well-nourished. No distress.  HENT:  Right Ear: External ear normal.  Left Ear: External ear normal.  Oropharynx with scattered erythema, red streaking and mild cobblestoning and clear PND. No exudates or swelling.  Eyes: Conjunctivae and EOM are normal.  Neck: Normal range of motion. Neck supple.  Cardiovascular: Normal rate, regular rhythm and normal heart sounds.   Pulmonary/Chest: Effort normal. No respiratory distress. She has wheezes.  Diffuse bilateral coarseness and expiratory wheezes.  Musculoskeletal: Normal range of motion. She exhibits no edema.  Lymphadenopathy:    She has no cervical adenopathy.  Neurological: She is alert and oriented to person, place, and time. No cranial nerve deficit. She exhibits normal muscle tone.  Skin: Skin is warm and dry. No rash noted.  Psychiatric: She has a normal mood and affect.    ED Course  Procedures (including critical care time) Labs Review Labs Reviewed  POCT RAPID STREP A ([facility] C[facility] ONLY)   Imaging Review No results found.    MDM   1. URI (upper respiratory infection)   2. RAD (reactive airway disease) with wheezing        Kenalog 40 mg IM 1345h:  Finished 90% of Duoneb. Modest improvement> Continues with exp wheeze and diffuse coarseness, prolonged exp phase. 1405h: Pt st feels better and wants to go home. I offered another Alb neb but st she can use her mother's neb and Alb.   Albuterol HFA Qvar 40 2 BID norel CS as directed Plenty of fluids.  Hayden Rasmussen, NP 04/19/13 [phone]

## 2013-04-21 LAB — CULTURE, GROUP A STREP

## 2013-07-15 ENCOUNTER — Encounter (HOSPITAL_COMMUNITY): Payer: Self-pay | Admitting: Emergency Medicine

## 2013-07-15 ENCOUNTER — Emergency Department (INDEPENDENT_AMBULATORY_CARE_PROVIDER_SITE_OTHER)
Admission: EM | Admit: 2013-07-15 | Discharge: 2013-07-15 | Disposition: A | Discharge status: Home / Self Care | Payer: Self-pay | Source: Home / Self Care | Attending: Family Medicine | Admitting: Family Medicine

## 2013-07-15 DIAGNOSIS — R69 Illness, unspecified: Principal | ICD-10-CM

## 2013-07-15 DIAGNOSIS — J111 Influenza due to unidentified influenza virus with other respiratory manifestations: Secondary | ICD-10-CM

## 2013-07-15 MED ORDER — ONDANSETRON 4 MG PO TBDP: 4.0000 mg | ORAL_TABLET | Freq: Once | ORAL | Status: DC

## 2013-07-15 MED ORDER — ONDANSETRON 4 MG PO TBDP
ORAL_TABLET | ORAL | Status: AC
  Filled 2013-07-15: qty 1

## 2013-07-15 MED ORDER — IPRATROPIUM BROMIDE 0.06 % NA SOLN: 2.0000 | Freq: Four times a day (QID) | NASAL | Status: DC

## 2013-07-15 MED ORDER — ONDANSETRON HCL 4 MG PO TABS: 4.0000 mg | ORAL_TABLET | Freq: Four times a day (QID) | ORAL | Status: DC

## 2013-07-15 NOTE — ED Notes (Signed)
Patient complains of high temperature vomiting Diarrhea Aches all over Congestion Left leg got numb-had c section 9 months ago Raw throat

## 2013-07-15 NOTE — Discharge Instructions (Signed)
Clear liquid , bland diet tonight as tolerated, advance on tues as improved, use medicine as needed, imodium for diarrhea, probiotic as desired.return or see your doctor if any problems.

## 2013-07-15 NOTE — ED Provider Notes (Addendum)
CSN: [id number]     Arrival date & time 07/15/13  1956 History   First MD Initiated Contact with Patient 07/15/13 2037     Chief Complaint  Patient presents with  . Fever  . Emesis   (Consider location/radiation/quality/duration/timing/severity/associated sxs/prior Treatment) Patient is a 23 y.o. female presenting with fever and vomiting. The history is provided by the patient.  Fever Severity:  Moderate Onset quality:  Sudden Duration:  1 day Progression:  Unchanged Chronicity:  New Associated symptoms: congestion, cough, diarrhea, myalgias, nausea, rhinorrhea and vomiting   Associated symptoms: no dysuria   Emesis Associated symptoms: diarrhea and myalgias     Past Medical History  Diagnosis Date  . Anxiety   . Depression   . GERD (gastroesophageal reflux disease)   . Ovarian cyst   . Pregnant   . [id number](478.2)    Past Surgical History  Procedure Laterality Date  . Root canal    . No past surgeries    . Cesarean section N/A 10/17/2012    Procedure: CESAREAN SECTION;  Surgeon: Mickel Baas, MD;  Location: [facility];  Service: Obstetrics;  Laterality: N/A;   Family History  Problem Relation Age of Onset  . COPD Mother   . Asthma Mother   . Depression Mother   . Hypertension Mother   . Diabetes Father   . Alcohol abuse Father   . Depression Maternal Aunt   . Diabetes Maternal Uncle   . Heart disease Maternal Grandmother   . Hypertension Maternal Grandmother   . Osteoporosis Paternal Grandmother    History  Substance Use Topics  . Smoking status: Current Every Day Smoker -- 0.50 packs/day    Types: Cigarettes    Last Attempt to Quit: 02/19/2012  . Smokeless tobacco: Never Used  . Alcohol Use: No   OB History   Grav Para Term Preterm Abortions TAB SAB Ect Mult Living   2 1 1  0 1 0 1 0 0 1     Review of Systems  Constitutional: Positive for fever.  HENT: Positive for congestion and rhinorrhea.   Respiratory: Positive for cough. Negative for shortness  of breath and wheezing.   Gastrointestinal: Positive for nausea, vomiting and diarrhea.  Genitourinary: Negative for dysuria.  Musculoskeletal: Positive for myalgias.  Skin: Negative.     Allergies  Review of patient's allergies indicates no known allergies.  Home Medications   Current Outpatient Rx  Name  Route  Sig  Dispense  Refill  . acetaminophen (TYLENOL) 325 MG tablet   Oral   Take 650 mg by mouth daily as needed. For pain         . albuterol (PROVENTIL HFA;VENTOLIN HFA) 108 (90 BASE) MCG/ACT inhaler   Inhalation   Inhale 2 puffs into the lungs every 4 (four) hours as needed for wheezing.   1 Inhaler   0   . beclomethasone (QVAR) 40 MCG/ACT inhaler   Inhalation   Inhale 2 puffs into the lungs 2 (two) times daily.   1 Inhaler   12   . benzonatate (TESSALON) 200 MG capsule   Oral   Take 1 capsule (200 mg total) by mouth 3 (three) times daily as needed for cough.   60 capsule   0   . ibuprofen (ADVIL,MOTRIN) 600 MG tablet   Oral   Take 1 tablet (600 mg total) by mouth every 6 (six) hours.   30 tablet   0   . ipratropium (ATROVENT) 0.06 % nasal spray   Nasal  Place 2 sprays into the nose 4 (four) times daily.   15 mL   1   . methylPREDNISolone (MEDROL DOSEPAK) 4 MG tablet      follow package directions   21 tablet   0   . ondansetron (ZOFRAN) 4 MG tablet   Oral   Take 1 tablet (4 mg total) by mouth every 6 (six) hours.   12 tablet   0   . ondansetron (ZOFRAN) 4 MG tablet   Oral   Take 1 tablet (4 mg total) by mouth every 6 (six) hours. Prn n/v   6 tablet   0   . oxyCODONE-acetaminophen (PERCOCET/ROXICET) 5-325 MG per tablet   Oral   Take 1-2 tablets by mouth every 4 (four) hours as needed.   30 tablet   0   . Phenylephrine-Chlorphen-DM 03-31-11.5 MG/5ML LIQD   Oral   Take 5 mLs by mouth every 4 (four) hours as needed.   120 mL   0   . Prenatal Vit-Fe Fumarate-FA (PRENATAL MULTIVITAMIN) TABS   Oral   Take 1 tablet by mouth daily.           BP 130/75  Pulse 99  Temp(Src) 99.6 F (37.6 C) (Oral)  Resp 16  SpO2 100% Physical Exam  Nursing note and vitals reviewed. Constitutional: She is oriented to person, place, and time. She appears well-developed and well-nourished.  HENT:  Head: Normocephalic.  Right Ear: External ear normal.  Left Ear: External ear normal.  Mouth/Throat: Oropharynx is clear and moist.  Neck: Normal range of motion. Neck supple.  Cardiovascular: Normal rate, regular rhythm, normal heart sounds and intact distal pulses.   Pulmonary/Chest: Effort normal and breath sounds normal.  Abdominal: Soft. Bowel sounds are normal.  Lymphadenopathy:    She has no cervical adenopathy.  Neurological: She is alert and oriented to person, place, and time.  Skin: Skin is warm and dry.    ED Course  Procedures (including critical care time) Labs Review Labs Reviewed - No data to display Imaging Review No results found.  EKG Interpretation    Date/Time:    Ventricular Rate:    PR Interval:    QRS Duration:   QT Interval:    QTC Calculation:   R Axis:     Text Interpretation:              MDM   1. Influenza-like illness        Linna HoffJames D Ayham Word, MD 07/15/13 [id number]  Linna HoffJames D Poet Hineman, MD 07/15/13 2106

## 2013-12-10 ENCOUNTER — Emergency Department (HOSPITAL_BASED_OUTPATIENT_CLINIC_OR_DEPARTMENT_OTHER): Payer: Medicaid Other

## 2013-12-10 ENCOUNTER — Emergency Department (HOSPITAL_BASED_OUTPATIENT_CLINIC_OR_DEPARTMENT_OTHER)
Admission: EM | Admit: 2013-12-10 | Discharge: 2013-12-10 | Disposition: A | Discharge status: Home / Self Care | Payer: Medicaid Other | Attending: Emergency Medicine | Admitting: Emergency Medicine

## 2013-12-10 ENCOUNTER — Encounter (HOSPITAL_BASED_OUTPATIENT_CLINIC_OR_DEPARTMENT_OTHER): Payer: Self-pay | Admitting: Emergency Medicine

## 2013-12-10 DIAGNOSIS — F17200 Nicotine dependence, unspecified, uncomplicated: Secondary | ICD-10-CM | POA: Diagnosis not present

## 2013-12-10 DIAGNOSIS — R112 Nausea with vomiting, unspecified: Secondary | ICD-10-CM | POA: Insufficient documentation

## 2013-12-10 DIAGNOSIS — F329 Major depressive disorder, single episode, unspecified: Secondary | ICD-10-CM | POA: Diagnosis not present

## 2013-12-10 DIAGNOSIS — Z3202 Encounter for pregnancy test, result negative: Secondary | ICD-10-CM | POA: Insufficient documentation

## 2013-12-10 DIAGNOSIS — Z8719 Personal history of other diseases of the digestive system: Secondary | ICD-10-CM | POA: Insufficient documentation

## 2013-12-10 DIAGNOSIS — IMO0002 Reserved for concepts with insufficient information to code with codable children: Secondary | ICD-10-CM | POA: Insufficient documentation

## 2013-12-10 DIAGNOSIS — F411 Generalized anxiety disorder: Secondary | ICD-10-CM | POA: Insufficient documentation

## 2013-12-10 DIAGNOSIS — R197 Diarrhea, unspecified: Secondary | ICD-10-CM | POA: Insufficient documentation

## 2013-12-10 DIAGNOSIS — Z79899 Other long term (current) drug therapy: Secondary | ICD-10-CM | POA: Diagnosis not present

## 2013-12-10 DIAGNOSIS — R1033 Periumbilical pain: Secondary | ICD-10-CM | POA: Diagnosis present

## 2013-12-10 DIAGNOSIS — F3289 Other specified depressive episodes: Secondary | ICD-10-CM | POA: Insufficient documentation

## 2013-12-10 DIAGNOSIS — Z791 Long term (current) use of non-steroidal anti-inflammatories (NSAID): Secondary | ICD-10-CM | POA: Diagnosis not present

## 2013-12-10 DIAGNOSIS — Z8742 Personal history of other diseases of the female genital tract: Secondary | ICD-10-CM | POA: Insufficient documentation

## 2013-12-10 LAB — CBC WITH DIFFERENTIAL/PLATELET
Basophils Absolute: 0 10*3/uL (ref 0.0–0.1)
Basophils Relative: 0 % (ref 0–1)
EOS ABS: 0.3 10*3/uL (ref 0.0–0.7)
EOS PCT: 2 % (ref 0–5)
HCT: 42.9 % (ref 36.0–46.0)
Hemoglobin: 14.4 g/dL (ref 12.0–15.0)
Lymphocytes Relative: 11 % — ABNORMAL LOW (ref 12–46)
Lymphs Abs: 1.7 10*3/uL (ref 0.7–4.0)
MCH: 29.4 pg (ref 26.0–34.0)
MCHC: 33.6 g/dL (ref 30.0–36.0)
MCV: 87.7 fL (ref 78.0–100.0)
Monocytes Absolute: 1.1 10*3/uL — ABNORMAL HIGH (ref 0.1–1.0)
Monocytes Relative: 7 % (ref 3–12)
Neutro Abs: 12.1 10*3/uL — ABNORMAL HIGH (ref 1.7–7.7)
Neutrophils Relative %: 80 % — ABNORMAL HIGH (ref 43–77)
PLATELETS: 287 10*3/uL (ref 150–400)
RBC: 4.89 MIL/uL (ref 3.87–5.11)
RDW: 14 % (ref 11.5–15.5)
WBC: 15.2 10*3/uL — ABNORMAL HIGH (ref 4.0–10.5)

## 2013-12-10 LAB — COMPREHENSIVE METABOLIC PANEL
ALT: 16 U/L (ref 0–35)
AST: 14 U/L (ref 0–37)
Albumin: 4.4 g/dL (ref 3.5–5.2)
Alkaline Phosphatase: 85 U/L (ref 39–117)
BUN: 15 mg/dL (ref 6–23)
CALCIUM: 9.5 mg/dL (ref 8.4–10.5)
CO2: 21 mEq/L (ref 19–32)
Chloride: 104 mEq/L (ref 96–112)
Creatinine, Ser: 0.6 mg/dL (ref 0.50–1.10)
GFR calc non Af Amer: >90 mL/min (ref >90)
Glucose, Bld: 111 mg/dL — ABNORMAL HIGH (ref 70–99)
Potassium: 4.6 mEq/L (ref 3.7–5.3)
SODIUM: 140 meq/L (ref 137–147)
TOTAL PROTEIN: 7.4 g/dL (ref 6.0–8.3)
Total Bilirubin: <0.2 mg/dL — ABNORMAL LOW (ref 0.3–1.2)

## 2013-12-10 LAB — URINALYSIS, ROUTINE W REFLEX MICROSCOPIC
Bilirubin Urine: NEGATIVE
Glucose, UA: NEGATIVE mg/dL
Hgb urine dipstick: NEGATIVE
Ketones, ur: NEGATIVE mg/dL
LEUKOCYTES UA: NEGATIVE
NITRITE: NEGATIVE
Protein, ur: NEGATIVE mg/dL
Specific Gravity, Urine: 1.023 (ref 1.005–1.030)
UROBILINOGEN UA: 0.2 mg/dL (ref 0.0–1.0)
pH: 5.5 (ref 5.0–8.0)

## 2013-12-10 LAB — LIPASE, BLOOD: LIPASE: 22 U/L (ref 11–59)

## 2013-12-10 LAB — WET PREP, GENITAL
Clue Cells Wet Prep HPF POC: NONE SEEN
Trich, Wet Prep: NONE SEEN
YEAST WET PREP: NONE SEEN

## 2013-12-10 LAB — PREGNANCY, URINE: Preg Test, Ur: NEGATIVE

## 2013-12-10 MED ORDER — SODIUM CHLORIDE 0.9 % IV BOLUS (SEPSIS)
1000.0000 mL | Freq: Once | INTRAVENOUS | Status: AC
  Administered 2013-12-10: 1000 mL via INTRAVENOUS

## 2013-12-10 MED ORDER — MORPHINE SULFATE 2 MG/ML IJ SOLN
2.0000 mg | Freq: Once | INTRAMUSCULAR | Status: AC
  Administered 2013-12-10: 2 mg via INTRAVENOUS
  Filled 2013-12-10: qty 1

## 2013-12-10 MED ORDER — IOHEXOL 300 MG/ML  SOLN
100.0000 mL | Freq: Once | INTRAMUSCULAR | Status: AC | PRN
  Administered 2013-12-10: 100 mL via INTRAVENOUS

## 2013-12-10 MED ORDER — LORAZEPAM 2 MG/ML IJ SOLN
1.0000 mg | Freq: Once | INTRAMUSCULAR | Status: AC
  Administered 2013-12-10: 1 mg via INTRAVENOUS
  Filled 2013-12-10: qty 1

## 2013-12-10 MED ORDER — MORPHINE SULFATE 4 MG/ML IJ SOLN
4.0000 mg | Freq: Once | INTRAMUSCULAR | Status: AC
  Administered 2013-12-10: 4 mg via INTRAVENOUS
  Filled 2013-12-10: qty 1

## 2013-12-10 MED ORDER — ONDANSETRON HCL 4 MG/2ML IJ SOLN
4.0000 mg | Freq: Once | INTRAMUSCULAR | Status: AC
  Administered 2013-12-10: 4 mg via INTRAVENOUS
  Filled 2013-12-10: qty 2

## 2013-12-10 MED ORDER — IOHEXOL 300 MG/ML  SOLN
50.0000 mL | Freq: Once | INTRAMUSCULAR | Status: AC | PRN
Start: 2013-12-10 — End: 2013-12-10
  Administered 2013-12-10: 50 mL via ORAL

## 2013-12-10 MED ORDER — ONDANSETRON 4 MG PO TBDP: ORAL_TABLET | ORAL | Status: DC

## 2013-12-10 NOTE — ED Notes (Signed)
Pt completed CT constrast

## 2013-12-10 NOTE — ED Provider Notes (Signed)
CSN: [id number]     Arrival date & time 12/10/13  0855 History   First MD Initiated Contact with Patient 12/10/13 0911     Chief Complaint  Patient presents with  . Abdominal Pain     (Consider location/radiation/quality/duration/timing/severity/associated sxs/prior Treatment) Patient is a 23 y.o. female presenting with abdominal pain. The history is provided by the patient.  Abdominal Pain Pain location:  Periumbilical and suprapubic Pain quality: sharp   Pain radiates to:  Does not radiate Pain severity:  Moderate Onset quality:  Sudden Timing:  Constant Progression:  Unchanged Chronicity:  New Context: awakening from sleep   Context: not eating, not sick contacts and not suspicious food intake   Relieved by:  Nothing Worsened by:  Nothing tried Ineffective treatments:  None tried Associated symptoms: diarrhea (x7), nausea and vomiting (x2)   Associated symptoms: no chills, no cough, no dysuria, no fever, no hematemesis, no hematochezia and no hematuria     Past Medical History  Diagnosis Date  . Anxiety   . Depression   . GERD (gastroesophageal reflux disease)   . Ovarian cyst   . Pregnant   . OZHYQMVH(846.9Headache(784.0)    Past Surgical History  Procedure Laterality Date  . Root canal    . No past surgeries    . Cesarean section N/A 10/17/2012    Procedure: CESAREAN SECTION;  Surgeon: Mickel Baasichard D Kaplan, MD;  Location: [facility];  Service: Obstetrics;  Laterality: N/A;   Family History  Problem Relation Age of Onset  . COPD Mother   . Asthma Mother   . Depression Mother   . Hypertension Mother   . Diabetes Father   . Alcohol abuse Father   . Depression Maternal Aunt   . Diabetes Maternal Uncle   . Heart disease Maternal Grandmother   . Hypertension Maternal Grandmother   . Osteoporosis Paternal Grandmother    History  Substance Use Topics  . Smoking status: Current Every Day Smoker -- 0.50 packs/day    Types: Cigarettes    Last Attempt to Quit: 02/19/2012  .  Smokeless tobacco: Never Used  . Alcohol Use: No   OB History   Grav Para Term Preterm Abortions TAB SAB Ect Mult Living   2 1 1  0 1 0 1 0 0 1     Review of Systems  Constitutional: Negative for fever and chills.  Respiratory: Negative for cough.   Gastrointestinal: Positive for nausea, vomiting (x2), abdominal pain and diarrhea (x7). Negative for hematochezia and hematemesis.  Genitourinary: Negative for dysuria and hematuria.  All other systems reviewed and are negative.     Allergies  Review of patient's allergies indicates no known allergies.  Home Medications   Prior to Admission medications   Medication Sig Start Date End Date Taking? Authorizing Trevis Eden  etonogestrel (NEXPLANON) 68 MG IMPL implant Inject 1 each into the skin once.   Yes Historical Jarmarcus Wambold, MD  acetaminophen (TYLENOL) 325 MG tablet Take 650 mg by mouth daily as needed. For pain    Historical Catelynn Sparger, MD  albuterol (PROVENTIL HFA;VENTOLIN HFA) 108 (90 BASE) MCG/ACT inhaler Inhale 2 puffs into the lungs every 4 (four) hours as needed for wheezing. 04/19/13   Hayden Rasmussenavid Mabe, NP  beclomethasone (QVAR) 40 MCG/ACT inhaler Inhale 2 puffs into the lungs 2 (two) times daily. 04/19/13   Hayden Rasmussenavid Mabe, NP  benzonatate (TESSALON) 200 MG capsule Take 1 capsule (200 mg total) by mouth 3 (three) times daily as needed for cough. 12/05/12   Graylon GoodZachary H Baker,  PA-C  ibuprofen (ADVIL,MOTRIN) 600 MG tablet Take 1 tablet (600 mg total) by mouth every 6 (six) hours. 10/20/12   Levi Aland, MD  ipratropium (ATROVENT) 0.06 % nasal spray Place 2 sprays into the nose 4 (four) times daily. 07/15/13   Linna Hoff, MD  methylPREDNISolone (MEDROL DOSEPAK) 4 MG tablet follow package directions 04/19/13   Hayden Rasmussen, NP  ondansetron (ZOFRAN) 4 MG tablet Take 1 tablet (4 mg total) by mouth every 6 (six) hours. 01/19/13   Fayrene Helper, PA-C  ondansetron (ZOFRAN) 4 MG tablet Take 1 tablet (4 mg total) by mouth every 6 (six) hours. Prn n/v 07/15/13    Linna Hoff, MD  oxyCODONE-acetaminophen (PERCOCET/ROXICET) 5-325 MG per tablet Take 1-2 tablets by mouth every 4 (four) hours as needed. 10/20/12   Levi Aland, MD  Phenylephrine-Chlorphen-DM 03-31-11.5 MG/5ML LIQD Take 5 mLs by mouth every 4 (four) hours as needed. 04/19/13   Hayden Rasmussen, NP  Prenatal Vit-Fe Fumarate-FA (PRENATAL MULTIVITAMIN) TABS Take 1 tablet by mouth daily.    Historical Sailor Haughn, MD   BP 129/77  Pulse 82  Temp(Src) 97.7 F (36.5 C) (Oral)  Resp 16  Ht 5' 0.5" (1.537 m)  Wt 145 lb (65.772 kg)  BMI 27.84 kg/m2  SpO2 100%  LMP 12/03/2013 Physical Exam  Nursing note and vitals reviewed. Constitutional: She is oriented to person, place, and time. She appears well-developed and well-nourished. No distress.  HENT:  Head: Normocephalic and atraumatic.  Eyes: EOM are normal. Pupils are equal, round, and reactive to light.  Neck: Normal range of motion. Neck supple.  Cardiovascular: Normal rate and regular rhythm.  Exam reveals no friction rub.   No murmur heard. Pulmonary/Chest: Effort normal and breath sounds normal. No respiratory distress. She has no wheezes. She has no rales.  Abdominal: Soft. She exhibits no distension. There is tenderness (periumbilical, suprapubic - moderate. Very mild diffusely elsewhere). There is no rebound.  Musculoskeletal: Normal range of motion. She exhibits no edema.  Neurological: She is alert and oriented to person, place, and time.  Skin: She is not diaphoretic.    ED Course  Procedures (including critical care time) Labs Review Labs Reviewed  CBC WITH DIFFERENTIAL - Abnormal; Notable for the following:    WBC 15.2 (*)    Neutrophils Relative % 80 (*)    Neutro Abs 12.1 (*)    Lymphocytes Relative 11 (*)    Monocytes Absolute 1.1 (*)    All other components within normal limits  URINALYSIS, ROUTINE W REFLEX MICROSCOPIC  PREGNANCY, URINE  COMPREHENSIVE METABOLIC PANEL  LIPASE, BLOOD    Imaging Review Ct Abdomen  Pelvis W Contrast  12/10/2013   CLINICAL DATA:  Right-sided pelvic pain  EXAM: CT ABDOMEN AND PELVIS WITH CONTRAST  TECHNIQUE: Multidetector CT imaging of the abdomen and pelvis was performed using the standard protocol following bolus administration of intravenous contrast.  CONTRAST:  50mL OMNIPAQUE IOHEXOL 300 MG/ML SOLN, OMNIPAQUE IOHEXOL 300 MG/ML SOLN  COMPARISON:  None.  FINDINGS: The lung bases are free of acute infiltrate or sizable effusion. The liver, gallbladder, spleen, adrenal glands and pancreas are all normal in their CT appearance. Kidneys demonstrate a normal enhancement pattern. No renal calculi or obstructive changes are seen.  The appendix is barium filled and within normal limits no inflammatory changes are seen. The bladder is well distended. The uterus is within normal limits. Follicular changes are noted within the ovaries bilaterally. No significant free pelvic fluid is noted. No  bony abnormality is noted.  IMPRESSION: No acute abnormality is noted.   Electronically Signed   By: Alcide CleverMark  Lukens M.D.   On: 12/10/2013 11:57     EKG Interpretation None      MDM   Final diagnoses:  Nausea vomiting and diarrhea    47F here with central lower abdominal pain. Began this morning. Sharp. Associated diarrhea x 7, vomiting x 2. No blood in either. No fever. Feels like ovarian cyst pain but not in similar location. Hx of C-section, GERD.  On exam, moderate periumbilical, suprapubic pain. Very mild pain diffusely elsewhere. No rebound or guarding.  Will perform pelvic, draw labs, give fluids, nausea/pain meds. Mild uterine pain, mild adnexal pain bilaterally, R>L. Patient's diarrhea, vomiting not c/w ovarian torsion. She also reports hx of appendicitis that was not operated on while living in [place]. Will scan abdomen/pelvis. CT normal. Feeling better after some fluids, anti-emetics.   Dagmar HaitWilliam Blair Walden, MD 12/10/13 [phone]

## 2013-12-10 NOTE — ED Notes (Signed)
Pt reports mid/lower abdominal pain this morning as well as diarrhea and vomiting x 1. Denies dysuria, denies bloody or dark stool or emesis. No fever.

## 2013-12-10 NOTE — Discharge Instructions (Signed)
Diarrhea °Diarrhea is frequent loose and watery bowel movements. It can cause you to feel weak and dehydrated. Dehydration can cause you to become tired and thirsty, have a dry mouth, and have decreased urination that often is dark yellow. Diarrhea is a sign of another problem, most often an infection that will not last long. In most cases, diarrhea typically lasts 2 3 days. However, it can last longer if it is a sign of something more serious. It is important to treat your diarrhea as directed by your caregive to lessen or prevent future episodes of diarrhea. °CAUSES  °Some common causes include: °· Gastrointestinal infections caused by viruses, bacteria, or parasites. °· Food poisoning or food allergies. °· Certain medicines, such as antibiotics, chemotherapy, and laxatives. °· Artificial sweeteners and fructose. °· Digestive disorders. °HOME CARE INSTRUCTIONS °· Ensure adequate fluid intake (hydration): have 1 cup (8 oz) of fluid for each diarrhea episode. Avoid fluids that contain simple sugars or sports drinks, fruit juices, whole milk products, and sodas. Your urine should be clear or pale yellow if you are drinking enough fluids. Hydrate with an oral rehydration solution that you can purchase at pharmacies, retail stores, and online. You can prepare an oral rehydration solution at home by mixing the following ingredients together: °·   tsp table salt. °· ¾ tsp baking soda. °·  tsp salt substitute containing potassium chloride. °· 1  tablespoons sugar. °· 1 L (34 oz) of water. °· Certain foods and beverages may increase the speed at which food moves through the gastrointestinal (GI) tract. These foods and beverages should be avoided and include: °· Caffeinated and alcoholic beverages. °· High-fiber foods, such as raw fruits and vegetables, nuts, seeds, and whole grain breads and cereals. °· Foods and beverages sweetened with sugar alcohols, such as xylitol, sorbitol, and mannitol. °· Some foods may be well  tolerated and may help thicken stool including: °· Starchy foods, such as rice, toast, pasta, low-sugar cereal, oatmeal, grits, baked potatoes, crackers, and bagels. °· Bananas. °· Applesauce. °· Add probiotic-rich foods to help increase healthy bacteria in the GI tract, such as yogurt and fermented milk products. °· Wash your hands well after each diarrhea episode. °· Only take over-the-counter or prescription medicines as directed by your caregiver. °· Take a warm bath to relieve any burning or pain from frequent diarrhea episodes. °SEEK IMMEDIATE MEDICAL CARE IF:  °· You are unable to keep fluids down. °· You have persistent vomiting. °· You have blood in your stool, or your stools are black and tarry. °· You do not urinate in 6 8 hours, or there is only a small amount of very dark urine. °· You have abdominal pain that increases or localizes. °· You have weakness, dizziness, confusion, or lightheadedness. °· You have a severe headache. °· Your diarrhea gets worse or does not get better. °· You have a fever or persistent symptoms for more than 2 3 days. °· You have a fever and your symptoms suddenly get worse. °MAKE SURE YOU:  °· Understand these instructions. °· Will watch your condition. °· Will get help right away if you are not doing well or get worse. °Document Released: 06/03/2002 Document Revised: 05/30/2012 Document Reviewed: 02/19/2012 °Exit[employer]® Patient Information ©2014 [employer]. ° °Nausea and Vomiting °Nausea is a sick feeling that often comes before throwing up (vomiting). Vomiting is a reflex where stomach contents come out of your mouth. Vomiting can cause severe loss of body fluids (dehydration). Children and elderly adults can become   dehydrated quickly, especially if they also have diarrhea. Nausea and vomiting are symptoms of a condition or disease. It is important to find the cause of your symptoms. °CAUSES  °· Direct irritation of the stomach lining. This irritation can result from  increased acid production (gastroesophageal reflux disease), infection, food poisoning, taking certain medicines (such as nonsteroidal anti-inflammatory drugs), alcohol use, or tobacco use. °· Signals from the brain. These signals could be caused by a headache, heat exposure, an inner ear disturbance, increased pressure in the brain from injury, infection, a tumor, or a concussion, pain, emotional stimulus, or metabolic problems. °· An obstruction in the gastrointestinal tract (bowel obstruction). °· Illnesses such as diabetes, hepatitis, gallbladder problems, appendicitis, kidney problems, cancer, sepsis, atypical symptoms of a heart attack, or eating disorders. °· Medical treatments such as chemotherapy and radiation. °· Receiving medicine that makes you sleep (general anesthetic) during surgery. °DIAGNOSIS °Your caregiver may ask for tests to be done if the problems do not improve after a few days. Tests may also be done if symptoms are severe or if the reason for the nausea and vomiting is not clear. Tests may include: °· Urine tests. °· Blood tests. °· Stool tests. °· Cultures (to look for evidence of infection). °· X-rays or other imaging studies. °Test results can help your caregiver make decisions about treatment or the need for additional tests. °TREATMENT °You need to stay well hydrated. Drink frequently but in small amounts. You may wish to drink water, sports drinks, clear broth, or eat frozen ice pops or gelatin dessert to help stay hydrated. When you eat, eating slowly may help prevent nausea. There are also some antinausea medicines that may help prevent nausea. °HOME CARE INSTRUCTIONS  °· Take all medicine as directed by your caregiver. °· If you do not have an appetite, do not force yourself to eat. However, you must continue to drink fluids. °· If you have an appetite, eat a normal diet unless your caregiver tells you differently. °· Eat a variety of complex carbohydrates (rice, wheat, potatoes,  bread), lean meats, yogurt, fruits, and vegetables. °· Avoid high-fat foods because they are more difficult to digest. °· Drink enough water and fluids to keep your urine clear or pale yellow. °· If you are dehydrated, ask your caregiver for specific rehydration instructions. Signs of dehydration may include: °· Severe thirst. °· Dry lips and mouth. °· Dizziness. °· Dark urine. °· Decreasing urine frequency and amount. °· Confusion. °· Rapid breathing or pulse. °SEEK IMMEDIATE MEDICAL CARE IF:  °· You have blood or brown flecks (like coffee grounds) in your vomit. °· You have black or bloody stools. °· You have a severe headache or stiff neck. °· You are confused. °· You have severe abdominal pain. °· You have chest pain or trouble breathing. °· You do not urinate at least once every 8 hours. °· You develop cold or clammy skin. °· You continue to vomit for longer than 24 to 48 hours. °· You have a fever. °MAKE SURE YOU:  °· Understand these instructions. °· Will watch your condition. °· Will get help right away if you are not doing well or get worse. °Document Released: 06/13/2005 Document Revised: 09/05/2011 Document Reviewed: 11/10/2010 °ExitCare® Patient Information ©2014 [employer]. ° °

## 2013-12-10 NOTE — ED Notes (Signed)
Pt up to [facility]

## 2013-12-11 LAB — GC/CHLAMYDIA PROBE AMP
CT Probe RNA: POSITIVE — AB
GC Probe RNA: NEGATIVE

## 2013-12-12 ENCOUNTER — Telehealth (HOSPITAL_BASED_OUTPATIENT_CLINIC_OR_DEPARTMENT_OTHER): Payer: Self-pay | Admitting: Emergency Medicine

## 2013-12-12 NOTE — Telephone Encounter (Signed)
+  Chlamydia. Chart sent to EDP office for review. [facility]HS attached. 

## 2013-12-28 ENCOUNTER — Telehealth (HOSPITAL_BASED_OUTPATIENT_CLINIC_OR_DEPARTMENT_OTHER): Payer: Self-pay | Admitting: Emergency Medicine

## 2014-04-28 ENCOUNTER — Encounter (HOSPITAL_BASED_OUTPATIENT_CLINIC_OR_DEPARTMENT_OTHER): Payer: Self-pay | Admitting: Emergency Medicine

## 2015-11-04 ENCOUNTER — Ambulatory Visit (INDEPENDENT_AMBULATORY_CARE_PROVIDER_SITE_OTHER): Payer: Medicaid Other | Admitting: Neurology

## 2015-11-04 ENCOUNTER — Encounter: Payer: Self-pay | Admitting: Neurology

## 2015-11-04 VITALS — Ht 60.05 in | Wt 128.0 lb

## 2015-11-04 DIAGNOSIS — R531 Weakness: Secondary | ICD-10-CM

## 2015-11-04 DIAGNOSIS — M542 Cervicalgia: Secondary | ICD-10-CM | POA: Diagnosis not present

## 2015-11-04 MED ORDER — MELOXICAM 7.5 MG PO TABS: 7.5000 mg | ORAL_TABLET | Freq: Two times a day (BID) | ORAL | Status: AC

## 2015-11-04 MED ORDER — DULOXETINE HCL 60 MG PO CPEP: 60.0000 mg | ORAL_CAPSULE | Freq: Every day | ORAL | Status: AC
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
## 2015-11-12 IMAGING — CT CT ABD-PELV W/ CM
2 of 4 series · 17 of 46 positions shown, 19 images · IV contrast (omnipaque)
Comparison: None.

CLINICAL DATA: Right-sided pelvic pain

EXAM:
CT ABDOMEN AND PELVIS WITH CONTRAST
TECHNIQUE: Multidetector CT imaging of the abdomen and pelvis was performed
using the standard protocol following bolus administration of
intravenous contrast.
CONTRAST:  50mL OMNIPAQUE IOHEXOL 300 MG/ML SOLN, 100mL OMNIPAQUE
IOHEXOL 300 MG/ML SOLN

[Series 2: abd/pelvis 5.0 b31f · axial · 0.70mm/px · z∈[+966,+1356]mm · 14 of 86 slices shown, 16 images]
[im 4/86  soft-tissue]
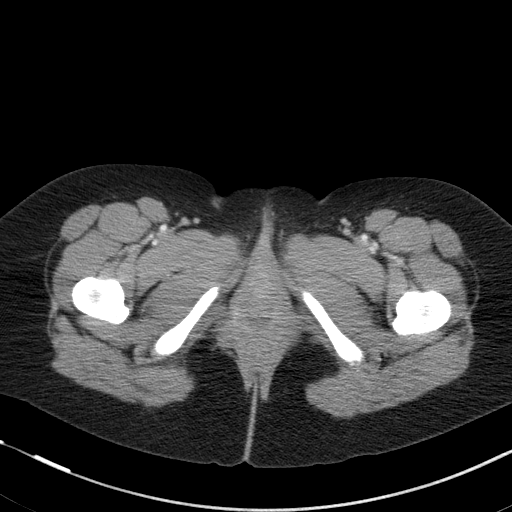
[im 4/86  bone]
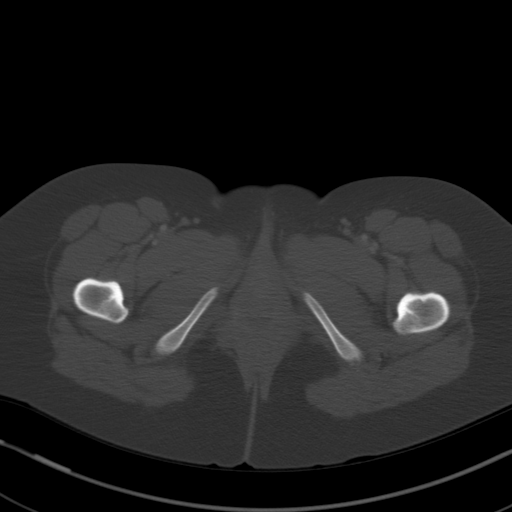
[im 11/86  soft-tissue]
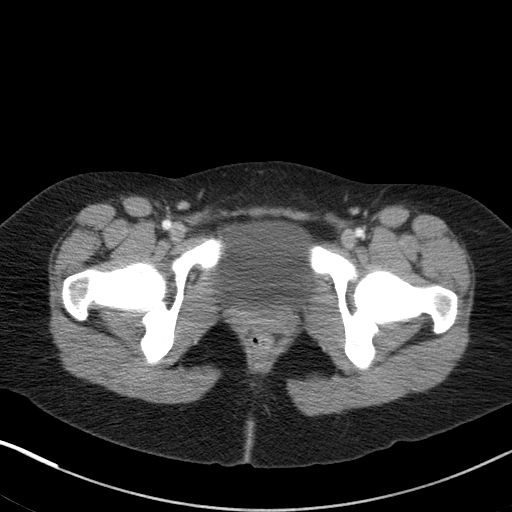
[im 18/86  soft-tissue]
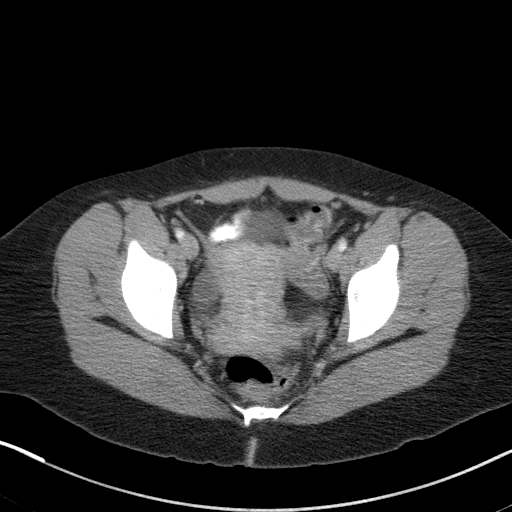
[im 22/86  soft-tissue]
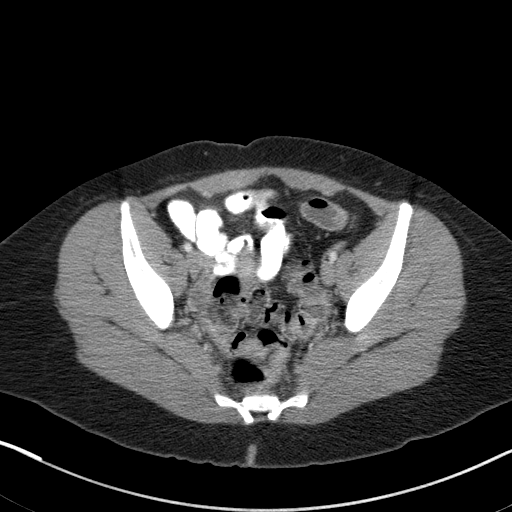
[im 29/86  soft-tissue]
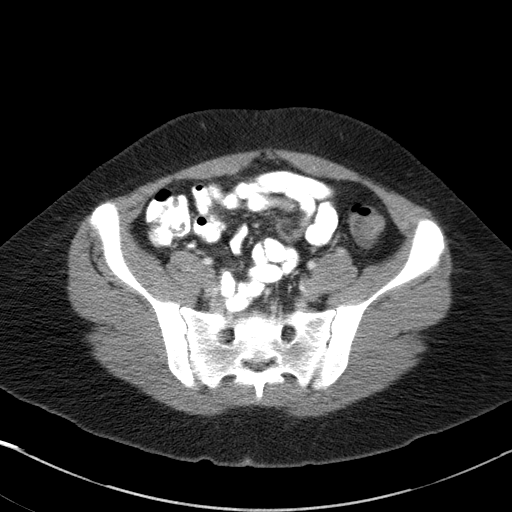
[im 36/86  soft-tissue]
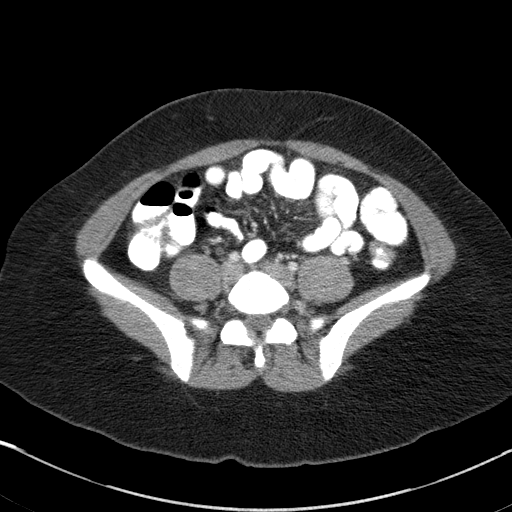
[im 39/86  soft-tissue]
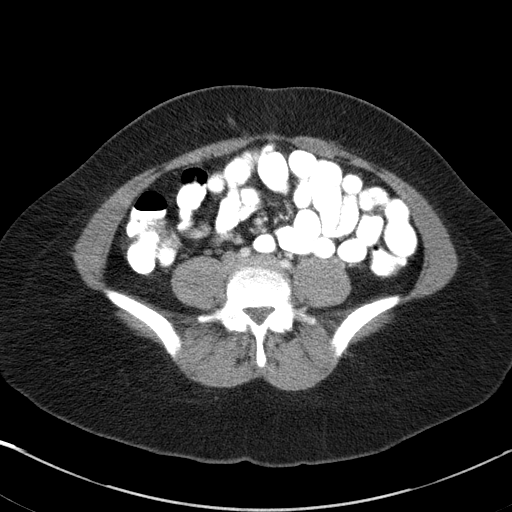
[im 47/86  soft-tissue]
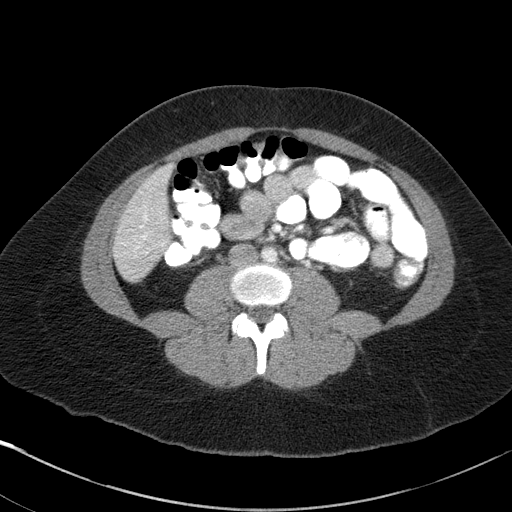
[im 50/86  soft-tissue]
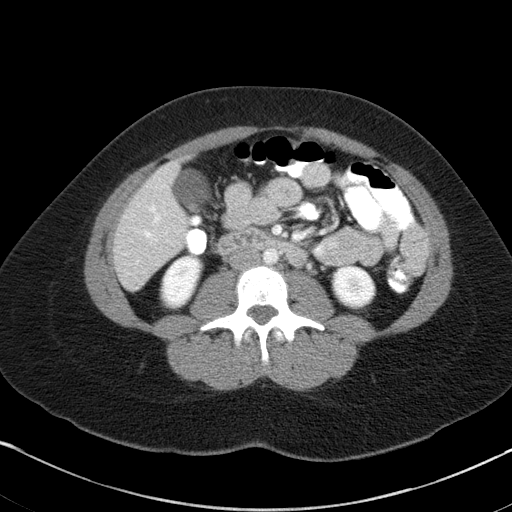
[im 50/86  bone]
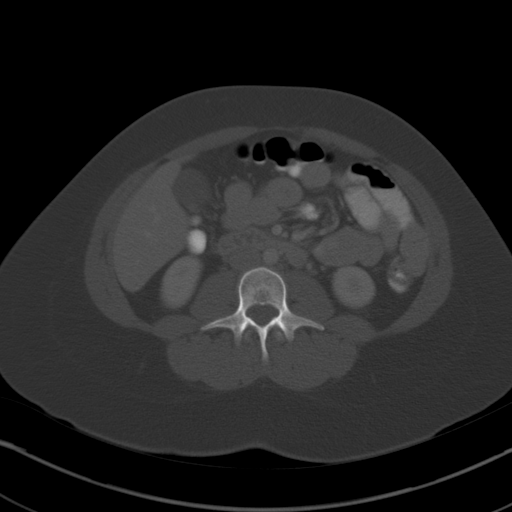
[im 57/86  soft-tissue]
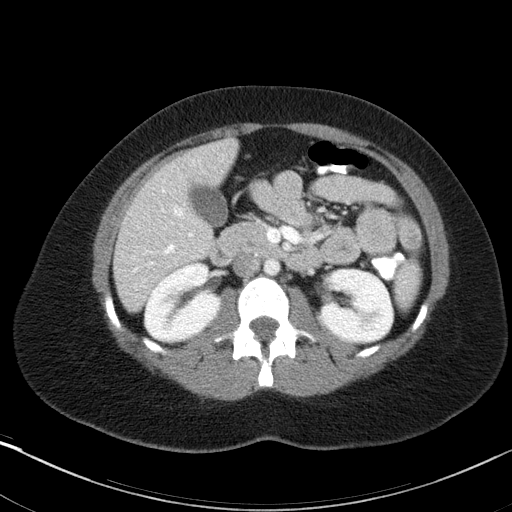
[im 64/86  soft-tissue]
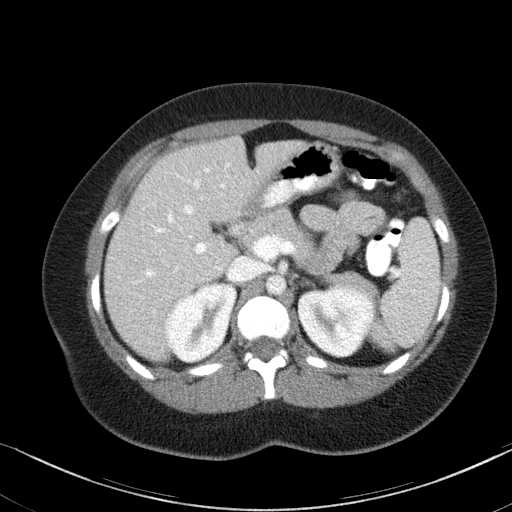
[im 68/86  soft-tissue]
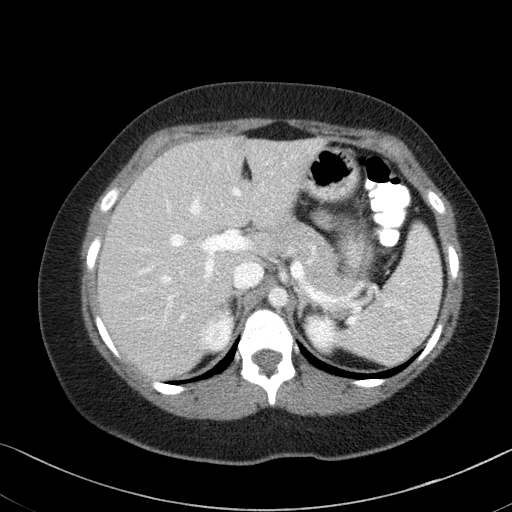
[im 75/86  soft-tissue]
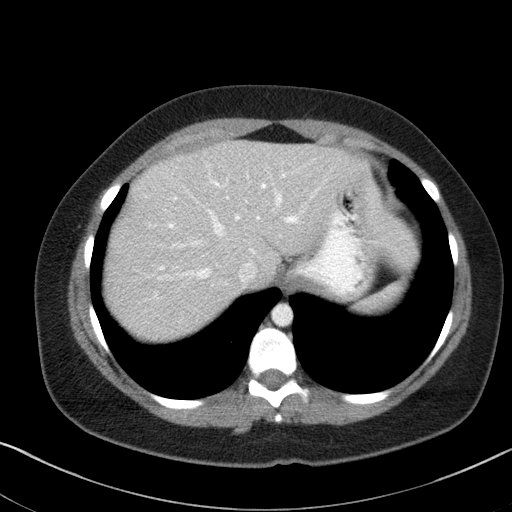
[im 82/86  soft-tissue]
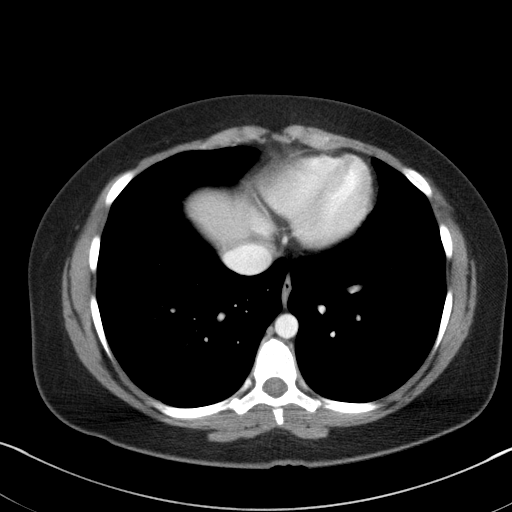

[Series 5: abd/pelvis 3.0 coronal · coronal · 0.67mm/px · 3 of 88 slices shown]
[im 30/88  soft-tissue]
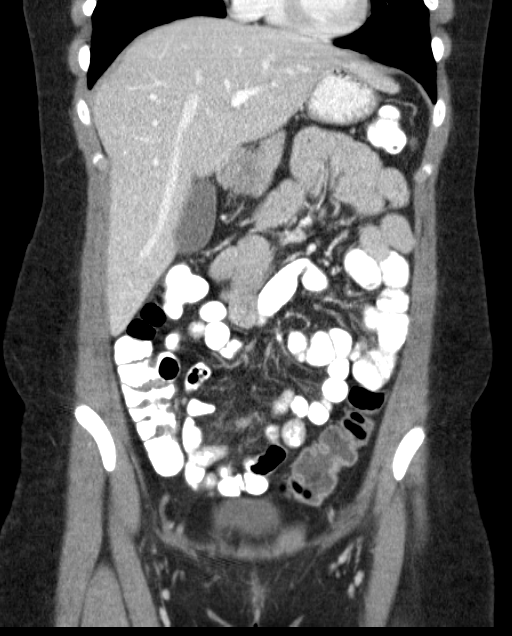
[im 39/88  soft-tissue]
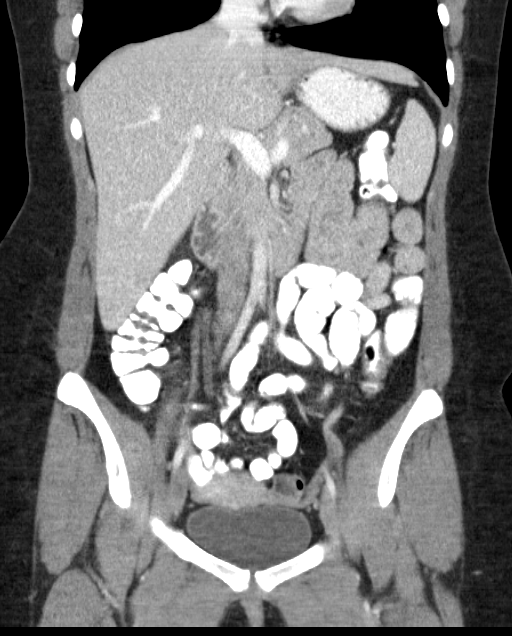
[im 49/88  soft-tissue]
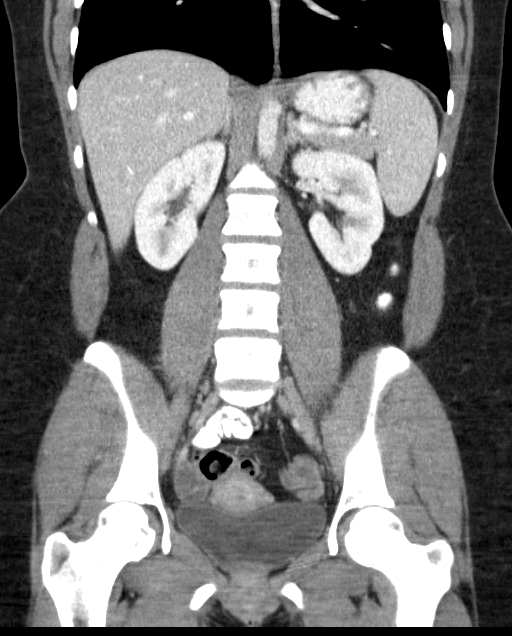

[17 of 46 positions shown; findings below may reference images not displayed]

FINDINGS: The lung bases are free of acute infiltrate or sizable effusion. The
liver, gallbladder, spleen, adrenal glands and pancreas are all
normal in their CT appearance. Kidneys demonstrate a normal
enhancement pattern. No renal calculi or obstructive changes are
seen.

The appendix is barium filled and within normal limits no
inflammatory changes are seen. The bladder is well distended. The
uterus is within normal limits. Follicular changes are noted within
the ovaries bilaterally. No significant free pelvic fluid is noted.
No bony abnormality is noted.
IMPRESSION: No acute abnormality is noted.

## 2015-12-15 ENCOUNTER — Ambulatory Visit: Payer: Self-pay | Admitting: Neurology

## 2015-12-31 ENCOUNTER — Ambulatory Visit: Payer: Self-pay | Admitting: Neurology

## 2015-12-31 ENCOUNTER — Telehealth: Payer: Self-pay | Admitting: *Deleted

## 2015-12-31 NOTE — Telephone Encounter (Signed)
No showed follow up appointment. 

## 2016-01-04 ENCOUNTER — Encounter: Payer: Self-pay | Admitting: Neurology

## 2017-06-24 ENCOUNTER — Encounter (HOSPITAL_COMMUNITY): Payer: Self-pay

## 2017-06-24 ENCOUNTER — Emergency Department (HOSPITAL_COMMUNITY)
Admission: EM | Admit: 2017-06-24 | Discharge: 2017-06-24 | Disposition: A | Discharge status: Home / Self Care | Payer: Medicaid Other | Attending: Emergency Medicine | Admitting: Emergency Medicine

## 2017-06-24 ENCOUNTER — Other Ambulatory Visit: Payer: Self-pay

## 2017-06-24 DIAGNOSIS — Z79899 Other long term (current) drug therapy: Secondary | ICD-10-CM | POA: Insufficient documentation

## 2017-06-24 DIAGNOSIS — F329 Major depressive disorder, single episode, unspecified: Secondary | ICD-10-CM | POA: Diagnosis present

## 2017-06-24 DIAGNOSIS — F17210 Nicotine dependence, cigarettes, uncomplicated: Secondary | ICD-10-CM | POA: Insufficient documentation

## 2017-06-24 DIAGNOSIS — F1910 Other psychoactive substance abuse, uncomplicated: Secondary | ICD-10-CM | POA: Diagnosis not present

## 2017-06-24 MED ORDER — DICYCLOMINE HCL 20 MG PO TABS: 20.0000 mg | ORAL_TABLET | Freq: Four times a day (QID) | ORAL | 0 refills | Status: AC | PRN

## 2017-06-24 MED ORDER — LOPERAMIDE HCL 2 MG PO CAPS
4.0000 mg | ORAL_CAPSULE | Freq: Once | ORAL | Status: AC
  Administered 2017-06-24: 4 mg via ORAL
  Filled 2017-06-24: qty 2

## 2017-06-24 MED ORDER — HYDROXYZINE HCL 25 MG PO TABS: 25.0000 mg | ORAL_TABLET | Freq: Four times a day (QID) | ORAL | 0 refills | Status: AC | PRN

## 2017-06-24 MED ORDER — NAPROXEN 500 MG PO TABS
500.0000 mg | ORAL_TABLET | Freq: Once | ORAL | Status: AC
  Administered 2017-06-24: 500 mg via ORAL
  Filled 2017-06-24: qty 1

## 2017-06-24 MED ORDER — METHOCARBAMOL 500 MG PO TABS: 500.0000 mg | ORAL_TABLET | Freq: Three times a day (TID) | ORAL | 0 refills | Status: AC | PRN

## 2017-06-24 MED ORDER — DICYCLOMINE HCL 20 MG PO TABS
20.0000 mg | ORAL_TABLET | Freq: Once | ORAL | Status: AC
  Administered 2017-06-24: 20 mg via ORAL
  Filled 2017-06-24: qty 1

## 2017-06-24 MED ORDER — LOPERAMIDE HCL 2 MG PO CAPS: 2.0000 mg | ORAL_CAPSULE | Freq: Two times a day (BID) | ORAL | 0 refills | Status: AC | PRN

## 2017-06-24 MED ORDER — ONDANSETRON 4 MG PO TBDP
4.0000 mg | ORAL_TABLET | Freq: Once | ORAL | Status: AC
  Administered 2017-06-24: 4 mg via ORAL
  Filled 2017-06-24: qty 1

## 2017-06-24 MED ORDER — METHOCARBAMOL 500 MG PO TABS
500.0000 mg | ORAL_TABLET | Freq: Once | ORAL | Status: AC
  Administered 2017-06-24: 500 mg via ORAL
  Filled 2017-06-24: qty 1

## 2017-06-24 MED ORDER — NAPROXEN 500 MG PO TABS: 500.0000 mg | ORAL_TABLET | Freq: Two times a day (BID) | ORAL | 0 refills | Status: AC | PRN

## 2017-06-24 MED ORDER — HYDROXYZINE HCL 25 MG PO TABS
25.0000 mg | ORAL_TABLET | Freq: Once | ORAL | Status: AC
  Administered 2017-06-24: 25 mg via ORAL
  Filled 2017-06-24: qty 1

## 2017-06-24 MED ORDER — ONDANSETRON HCL 4 MG PO TABS: 4.0000 mg | ORAL_TABLET | Freq: Four times a day (QID) | ORAL | 0 refills | Status: AC | PRN
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
## 2017-06-24 NOTE — Patient Outreach (Signed)
[facility] met with the patient and provided substance use recovery support. Patient reported that she is going to a sober living house in [place]. Patient seems to have strong plan to support long-term recovery for substance use. Patient has recovery support from mother as well. CPSS provided [facility] contact information and informed the patient to feel free to contact [facility]PSS at anytime for substance use recovery support.

## 2017-06-24 NOTE — ED Triage Notes (Signed)
She is here with her mom. She tearfully tells me she needs assistance to cease heroin and cocaine abuse; and tells us "I need to find a treatment center". She denies s.i./h.i.
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
## 2017-06-24 NOTE — Discharge Instructions (Signed)
Please follow with your primary care doctor in the next 2 days for a check-up. They must obtain records for further management.  ° °Do not hesitate to return to the Emergency Department for any new, worsening or concerning symptoms.  ° °
